# Patient Record
Sex: Male | Born: 1985 | Race: Black or African American | Hispanic: No | Marital: Single | Smoking: Current some day smoker
Health system: Southern US, Community
[De-identification: ages and names within clinical notes are randomized; demographics above are authoritative.]

## PROBLEM LIST (undated history)

## (undated) DIAGNOSIS — F329 Major depressive disorder, single episode, unspecified: Secondary | ICD-10-CM

## (undated) DIAGNOSIS — F319 Bipolar disorder, unspecified: Secondary | ICD-10-CM

## (undated) DIAGNOSIS — F32A Depression, unspecified: Secondary | ICD-10-CM

---
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
## 2004-01-15 ENCOUNTER — Emergency Department (HOSPITAL_COMMUNITY): Admission: EM | Admit: 2004-01-15 | Discharge: 2004-01-15 | Payer: Self-pay | Admitting: Emergency Medicine

## 2006-08-22 ENCOUNTER — Emergency Department (HOSPITAL_COMMUNITY): Admission: EM | Admit: 2006-08-22 | Discharge: 2006-08-22 | Payer: Self-pay | Admitting: Emergency Medicine

## 2011-01-31 ENCOUNTER — Emergency Department (HOSPITAL_COMMUNITY)
Admission: EM | Admit: 2011-01-31 | Discharge: 2011-01-31 | Disposition: A | Discharge status: Home / Self Care | Payer: Self-pay | Attending: Emergency Medicine | Admitting: Emergency Medicine

## 2011-01-31 DIAGNOSIS — R05 Cough: Secondary | ICD-10-CM | POA: Insufficient documentation

## 2011-01-31 DIAGNOSIS — R0602 Shortness of breath: Secondary | ICD-10-CM | POA: Insufficient documentation

## 2011-01-31 DIAGNOSIS — R197 Diarrhea, unspecified: Secondary | ICD-10-CM | POA: Insufficient documentation

## 2011-01-31 DIAGNOSIS — R059 Cough, unspecified: Secondary | ICD-10-CM | POA: Insufficient documentation

## 2011-01-31 DIAGNOSIS — J45901 Unspecified asthma with (acute) exacerbation: Secondary | ICD-10-CM | POA: Insufficient documentation

## 2011-01-31 DIAGNOSIS — R112 Nausea with vomiting, unspecified: Secondary | ICD-10-CM | POA: Insufficient documentation

## 2011-01-31 LAB — BASIC METABOLIC PANEL
BUN: 6 mg/dL (ref 6–23)
CO2: 22 mEq/L (ref 19–32)
Calcium: 9.6 mg/dL (ref 8.4–10.5)
Chloride: 98 mEq/L (ref 96–112)
Creatinine, Ser: 0.84 mg/dL (ref 0.50–1.35)
GFR calc Af Amer: >60 mL/min (ref >60)
GFR calc non Af Amer: >60 mL/min (ref >60)
Glucose, Bld: 85 mg/dL (ref 70–99)
Potassium: 4.8 mEq/L (ref 3.5–5.1)
Sodium: 135 mEq/L (ref 135–145)

## 2016-02-04 ENCOUNTER — Emergency Department (HOSPITAL_COMMUNITY)
Admission: EM | Admit: 2016-02-04 | Discharge: 2016-02-04 | Disposition: A | Discharge status: Other Acute Inpatient Hospital | Payer: Self-pay | Attending: Emergency Medicine | Admitting: Emergency Medicine

## 2016-02-04 ENCOUNTER — Inpatient Hospital Stay (HOSPITAL_COMMUNITY): Payer: Federal, State, Local not specified - Other

## 2016-02-04 ENCOUNTER — Encounter (HOSPITAL_COMMUNITY): Payer: Self-pay

## 2016-02-04 ENCOUNTER — Encounter (HOSPITAL_COMMUNITY): Payer: Self-pay | Admitting: Emergency Medicine

## 2016-02-04 ENCOUNTER — Inpatient Hospital Stay (HOSPITAL_COMMUNITY)
Admission: AD | Admit: 2016-02-04 | Discharge: 2016-02-09 | DRG: 885 | Disposition: A | Discharge status: Home / Self Care | Payer: Federal, State, Local not specified - Other | Source: Intra-hospital | Attending: Emergency Medicine | Admitting: Emergency Medicine

## 2016-02-04 DIAGNOSIS — F17210 Nicotine dependence, cigarettes, uncomplicated: Secondary | ICD-10-CM | POA: Diagnosis present

## 2016-02-04 DIAGNOSIS — G4700 Insomnia, unspecified: Secondary | ICD-10-CM

## 2016-02-04 DIAGNOSIS — F1220 Cannabis dependence, uncomplicated: Secondary | ICD-10-CM | POA: Diagnosis present

## 2016-02-04 DIAGNOSIS — Z818 Family history of other mental and behavioral disorders: Secondary | ICD-10-CM

## 2016-02-04 DIAGNOSIS — Z915 Personal history of self-harm: Secondary | ICD-10-CM | POA: Diagnosis not present

## 2016-02-04 DIAGNOSIS — F250 Schizoaffective disorder, bipolar type: Principal | ICD-10-CM | POA: Diagnosis present

## 2016-02-04 DIAGNOSIS — Z813 Family history of other psychoactive substance abuse and dependence: Secondary | ICD-10-CM

## 2016-02-04 DIAGNOSIS — F1010 Alcohol abuse, uncomplicated: Secondary | ICD-10-CM | POA: Diagnosis not present

## 2016-02-04 DIAGNOSIS — Z5900 Homelessness unspecified: Secondary | ICD-10-CM

## 2016-02-04 DIAGNOSIS — R45851 Suicidal ideations: Secondary | ICD-10-CM

## 2016-02-04 DIAGNOSIS — K219 Gastro-esophageal reflux disease without esophagitis: Secondary | ICD-10-CM | POA: Diagnosis present

## 2016-02-04 DIAGNOSIS — Z833 Family history of diabetes mellitus: Secondary | ICD-10-CM | POA: Diagnosis not present

## 2016-02-04 DIAGNOSIS — F329 Major depressive disorder, single episode, unspecified: Secondary | ICD-10-CM | POA: Diagnosis present

## 2016-02-04 DIAGNOSIS — F419 Anxiety disorder, unspecified: Secondary | ICD-10-CM | POA: Diagnosis present

## 2016-02-04 DIAGNOSIS — R443 Hallucinations, unspecified: Secondary | ICD-10-CM | POA: Insufficient documentation

## 2016-02-04 DIAGNOSIS — R0789 Other chest pain: Secondary | ICD-10-CM | POA: Diagnosis present

## 2016-02-04 DIAGNOSIS — F333 Major depressive disorder, recurrent, severe with psychotic symptoms: Secondary | ICD-10-CM | POA: Diagnosis present

## 2016-02-04 LAB — COMPREHENSIVE METABOLIC PANEL
ALK PHOS: 62 U/L (ref 38–126)
ALT: 14 U/L — ABNORMAL LOW (ref 17–63)
ANION GAP: 8 (ref 5–15)
AST: 19 U/L (ref 15–41)
Albumin: 4.5 g/dL (ref 3.5–5.0)
BUN: 6 mg/dL (ref 6–20)
CALCIUM: 9.2 mg/dL (ref 8.9–10.3)
CO2: 28 mmol/L (ref 22–32)
CREATININE: 0.78 mg/dL (ref 0.61–1.24)
Chloride: 105 mmol/L (ref 101–111)
Glucose, Bld: 92 mg/dL (ref 65–99)
Potassium: 3.1 mmol/L — ABNORMAL LOW (ref 3.5–5.1)
SODIUM: 141 mmol/L (ref 135–145)
Total Bilirubin: 0.6 mg/dL (ref 0.3–1.2)
Total Protein: 7.4 g/dL (ref 6.5–8.1)

## 2016-02-04 LAB — RAPID URINE DRUG SCREEN, HOSP PERFORMED
Amphetamines: NOT DETECTED
Barbiturates: NOT DETECTED
Benzodiazepines: NOT DETECTED
COCAINE: NOT DETECTED
OPIATES: NOT DETECTED
Tetrahydrocannabinol: POSITIVE — AB

## 2016-02-04 LAB — URINALYSIS, ROUTINE W REFLEX MICROSCOPIC
Bilirubin Urine: NEGATIVE
GLUCOSE, UA: NEGATIVE mg/dL
Hgb urine dipstick: NEGATIVE
KETONES UR: NEGATIVE mg/dL
NITRITE: NEGATIVE
PH: 7.5 (ref 5.0–8.0)
Protein, ur: NEGATIVE mg/dL
SPECIFIC GRAVITY, URINE: 1.019 (ref 1.005–1.030)

## 2016-02-04 LAB — URINE MICROSCOPIC-ADD ON: Bacteria, UA: NONE SEEN

## 2016-02-04 LAB — SALICYLATE LEVEL

## 2016-02-04 LAB — CBC
HCT: 40.1 % (ref 39.0–52.0)
HEMOGLOBIN: 14.3 g/dL (ref 13.0–17.0)
MCH: 29.2 pg (ref 26.0–34.0)
MCHC: 35.7 g/dL (ref 30.0–36.0)
MCV: 81.8 fL (ref 78.0–100.0)
PLATELETS: 264 10*3/uL (ref 150–400)
RBC: 4.9 MIL/uL (ref 4.22–5.81)
RDW: 13.2 % (ref 11.5–15.5)
WBC: 12.8 10*3/uL — ABNORMAL HIGH (ref 4.0–10.5)

## 2016-02-04 LAB — ETHANOL: ALCOHOL ETHYL (B): 68 mg/dL — AB (ref <5)

## 2016-02-04 LAB — ACETAMINOPHEN LEVEL

## 2016-02-04 MED ORDER — HALOPERIDOL 0.5 MG PO TABS
0.5000 mg | ORAL_TABLET | Freq: Two times a day (BID) | ORAL | Status: DC
  Administered 2016-02-05 (×2): 0.5 mg via ORAL
  Filled 2016-02-04 (×4): qty 1

## 2016-02-04 MED ORDER — MAGNESIUM HYDROXIDE 400 MG/5ML PO SUSP: 30.0000 mL | Freq: Every day | ORAL | Status: DC | PRN

## 2016-02-04 MED ORDER — TRAZODONE HCL 50 MG PO TABS
50.0000 mg | ORAL_TABLET | Freq: Every day | ORAL | Status: DC
  Administered 2016-02-05: 50 mg via ORAL
  Filled 2016-02-04 (×2): qty 1

## 2016-02-04 MED ORDER — CLONIDINE HCL 0.2 MG PO TABS
0.2000 mg | ORAL_TABLET | Freq: Once | ORAL | Status: AC
  Administered 2016-02-04: 0.2 mg via ORAL
  Filled 2016-02-04: qty 1
  Filled 2016-02-04: qty 2

## 2016-02-04 MED ORDER — FLUOXETINE HCL 20 MG PO CAPS
20.0000 mg | ORAL_CAPSULE | Freq: Every day | ORAL | Status: DC
  Administered 2016-02-05: 20 mg via ORAL
  Filled 2016-02-04 (×2): qty 1

## 2016-02-04 MED ORDER — ENSURE ENLIVE PO LIQD
237.0000 mL | Freq: Two times a day (BID) | ORAL | Status: DC
  Administered 2016-02-05 – 2016-02-09 (×6): 237 mL via ORAL
  Filled 2016-02-04: qty 237

## 2016-02-04 MED ORDER — POTASSIUM CHLORIDE CRYS ER 20 MEQ PO TBCR
40.0000 meq | EXTENDED_RELEASE_TABLET | Freq: Once | ORAL | Status: AC
  Administered 2016-02-04: 40 meq via ORAL
  Filled 2016-02-04: qty 2

## 2016-02-04 MED ORDER — ACETAMINOPHEN 325 MG PO TABS: 650.0000 mg | ORAL_TABLET | Freq: Four times a day (QID) | ORAL | Status: DC | PRN

## 2016-02-04 MED ORDER — BENZTROPINE MESYLATE 0.5 MG PO TABS
0.5000 mg | ORAL_TABLET | Freq: Two times a day (BID) | ORAL | Status: DC
  Administered 2016-02-05 – 2016-02-07 (×6): 0.5 mg via ORAL
  Filled 2016-02-04 (×10): qty 1

## 2016-02-04 MED ORDER — TRAZODONE HCL 50 MG PO TABS: 50.0000 mg | ORAL_TABLET | Freq: Every day | ORAL | Status: DC

## 2016-02-04 MED ORDER — FLUOXETINE HCL 20 MG PO CAPS
20.0000 mg | ORAL_CAPSULE | Freq: Every day | ORAL | Status: DC
  Administered 2016-02-04: 20 mg via ORAL
  Filled 2016-02-04: qty 1

## 2016-02-04 MED ORDER — KETOROLAC TROMETHAMINE 30 MG/ML IJ SOLN
30.0000 mg | Freq: Once | INTRAMUSCULAR | Status: AC
  Administered 2016-02-04: 30 mg via INTRAVENOUS
  Filled 2016-02-04: qty 1

## 2016-02-04 MED ORDER — HALOPERIDOL 1 MG PO TABS
0.5000 mg | ORAL_TABLET | Freq: Two times a day (BID) | ORAL | Status: DC
  Administered 2016-02-04: 0.5 mg via ORAL
  Filled 2016-02-04: qty 1

## 2016-02-04 MED ORDER — IBUPROFEN 400 MG PO TABS: 400.0000 mg | ORAL_TABLET | Freq: Four times a day (QID) | ORAL | Status: DC | PRN

## 2016-02-04 MED ORDER — LORAZEPAM 1 MG PO TABS
1.0000 mg | ORAL_TABLET | Freq: Once | ORAL | Status: AC
  Administered 2016-02-04: 1 mg via ORAL
  Filled 2016-02-04: qty 1

## 2016-02-04 MED ORDER — BENZTROPINE MESYLATE 1 MG PO TABS
0.5000 mg | ORAL_TABLET | Freq: Two times a day (BID) | ORAL | Status: DC
  Administered 2016-02-04: 0.5 mg via ORAL
  Filled 2016-02-04: qty 1

## 2016-02-04 MED ORDER — ALUM & MAG HYDROXIDE-SIMETH 200-200-20 MG/5ML PO SUSP: 30.0000 mL | ORAL | Status: DC | PRN

## 2016-02-04 NOTE — Progress Notes (Signed)
Left message for Pt's emergency contact, Scot Junracey McGee at [phone] to call back so that this staff member can notify that he has been moved to [facility] ED for cardiac workup.

## 2016-02-04 NOTE — ED Notes (Signed)
Patient noted sleeping in room. No complaints, stable, in no acute distress. Q15 minute rounds and monitoring via [employer] to continue.  

## 2016-02-04 NOTE — Discharge Instructions (Signed)

## 2016-02-04 NOTE — Progress Notes (Signed)
T.C. To [facility], to charge nurse Lequita HaltMorgan, informing her of Nehemias being transferred to ED for evaluation of chest pain.

## 2016-02-04 NOTE — Progress Notes (Signed)
Ko starting c/o chest pain at 2000.  BP coming down and he stated that the pain worsens with movement.  Attempted to call Gerrianne Scaleharles Cober NP for orders without success.  Called Dr. Elna BreslowEappen for admission orders and to report chest pain.  She wants him to be evaluated at the ED for the chest pain.

## 2016-02-04 NOTE — Progress Notes (Signed)
BP elevated 186/113 with dinamap, 170/118 manual.  Spoke with Renata Capriceonrad, NP.  Order obtained for clonidine 0.2mg  once and recheck BP in 30 minutes.  If BP is not down we will need to send him back to the ED for evaluation.

## 2016-02-04 NOTE — ED Notes (Signed)
On the phone 

## 2016-02-04 NOTE — ED Provider Notes (Signed)
CSN: [id number]     Arrival date & time 02/04/16  2117 History   First MD Initiated Contact with Patient 02/04/16 2141     Chief Complaint  Patient presents with  . Chest Pain     (Consider location/radiation/quality/duration/timing/severity/associated sxs/prior Treatment) HPI Comments: Patient presents to the emergency department with chief complaint of chest pain. He states pain started about 2 hours ago. It is located in the center of his chest. He states that it is worsened with movement and with chest flexion. He denies any associated shortness of breath. He does report a history of asthma, but denies any wheezing or chest tightness. He denies any change in his symptoms with lying versus sitting. He has not taken anything for his pain. Patient is moderate. Patient is currently at behavioral health for suicidal ideation under voluntary commitment.  The history is provided by the patient. No language interpreter was used.    History reviewed. No pertinent past medical history. History reviewed. No pertinent past surgical history. History reviewed. No pertinent family history. Social History  Substance Use Topics  . Smoking status: Current Some Day Smoker -- 0.25 packs/day    Types: Cigarettes  . Smokeless tobacco: None  . Alcohol Use: No    Review of Systems  Constitutional: Negative for fever and chills.  Respiratory: Negative for shortness of breath.   Cardiovascular: Positive for chest pain.  Gastrointestinal: Negative for nausea, vomiting, diarrhea and constipation.  Genitourinary: Negative for dysuria.  All other systems reviewed and are negative.     Allergies  Review of patient's allergies indicates no known allergies.  Home Medications   Prior to Admission medications   Not on File   BP 150/101 mmHg  Pulse 50  Temp(Src) 98 F (36.7 C) (Oral)  Resp 18  Ht 6\' 1"  (1.854 m)  Wt 63.05 kg  BMI 18.34 kg/m2  SpO2 100% Physical Exam  Constitutional: He is  oriented to person, place, and time. He appears well-developed and well-nourished.  HENT:  Head: Normocephalic and atraumatic.  Eyes: Conjunctivae and EOM are normal. Pupils are equal, round, and reactive to light. Right eye exhibits no discharge. Left eye exhibits no discharge. No scleral icterus.  Neck: Normal range of motion. Neck supple. No JVD present.  Cardiovascular: Normal rate, regular rhythm and normal heart sounds.  Exam reveals no gallop and no friction rub.   No murmur heard. Pulmonary/Chest: Effort normal and breath sounds normal. No respiratory distress. He has no wheezes. He has no rales. He exhibits no tenderness.  Clear to auscultation bilaterally Anterior chest wall mildly tender to palpation, pain is worsened with chest flexion  Abdominal: Soft. He exhibits no distension and no mass. There is no tenderness. There is no rebound and no guarding.  Musculoskeletal: Normal range of motion. He exhibits no edema or tenderness.  Neurological: He is alert and oriented to person, place, and time.  Skin: Skin is warm and dry.  Psychiatric: He has a normal mood and affect. His behavior is normal. Judgment and thought content normal.  Nursing note and vitals reviewed.   ED Course  Procedures (including critical care time)  Imaging Review Dg Chest 2 View  02/04/2016  CLINICAL DATA:  Chest pain. EXAM: CHEST  2 VIEW COMPARISON:  None. FINDINGS: Lungs are hyperexpanded. The lungs are clear wiithout focal pneumonia, edema, pneumothorax or pleural effusion. The cardiopericardial silhouette is within normal limits for size. The visualized bony structures of the thorax are intact. IMPRESSION: No active cardiopulmonary disease.  Electronically Signed   By: Kennith CenterEric  Mansell M.D.   On: 02/04/2016 22:48   I have personally reviewed and evaluated these images and lab results as part of my medical decision-making.   EKG Interpretation   Date/Time:  Saturday February 04 2016 21:43:20 EDT Ventricular  Rate:  55 PR Interval:    QRS Duration: 89 QT Interval:  459 QTC Calculation: 439 R Axis:   82 Text Interpretation:  Sinus rhythm ST elevation suggests acute  pericarditis Confirmed by HAVILAND MD, JULIE ([id number]) on 02/04/2016 9:48:51  PM      MDM   Final diagnoses:  Chest wall pain    Patient with chest pain started about 2 hours ago. It is for producible with chest flexion palpation. Chest x-ray is negative. Low risk for ACS, and PE. Patient feels much better after Toradol. No ischemic EKG changes, but does have some diffuse ST elevation, which could be pericarditis. Will discharge patient on anti-inflammatories.   Roxy Horsemanobert Jabe Jeanbaptiste, PA-C 02/04/16 [id number]  Jacalyn LefevreJulie Haviland, MD 02/05/16 [phone]

## 2016-02-04 NOTE — ED Notes (Signed)
Pt here voluntarily with [employer]. Pt states he is hearing voices telling him to kill himself. Pt denies HI. Pt is exteremly tearful during assessment. Pt states he "just wants the voices out of his head". To every other question pt answered "I don't know". Per [employer], pt's girlfriend called them because pt was threatening to harm himself. Pt has superficial old cuts to his wrists. Pt's girlfriend told police that he is not currently on any behavioral health medications.

## 2016-02-04 NOTE — Progress Notes (Addendum)
EMS called for transport to ED for evaluation of chest pain.

## 2016-02-04 NOTE — ED Notes (Signed)
Pt c/o central CP x2 hours, hurts with movement. Pt in NAD, pain 7/10. Given 1 nitro 324 asa by ems without relief. [facility].

## 2016-02-04 NOTE — ED Notes (Addendum)
[facility] contacted for transport

## 2016-02-04 NOTE — ED Notes (Signed)
Per [facility], pt had clonidine PTA

## 2016-02-04 NOTE — ED Notes (Signed)
Up to the bathroom 

## 2016-02-04 NOTE — ED Provider Notes (Signed)
CSN: [id number]     Arrival date & time 02/04/16  0045 History   First MD Initiated Contact with Patient 02/04/16 0119     Chief Complaint  Patient presents with  . Medical Clearance  . Suicidal  . Hallucinations     (Consider location/radiation/quality/duration/timing/severity/associated sxs/prior Treatment) HPI Comments: 30 y/o male presents to the ED for suicidal ideations. He states that he has been hearing voices for months and trying to self medication with marijuana to make the voices stop. He states that the voices have been telling him to kill himself. He "just wants the voices out of my head" and will occasionally bang his head against the wall to try and make them stop. He reports trying to slit his wrists to kill himself recently. Patient tearful, stating he is scared. He denies a hx of similar symptoms. He does allude to [facility]. Patient voluntary and open to inpatient management.  The history is provided by the patient. No language interpreter was used.    History reviewed. No pertinent past medical history. History reviewed. No pertinent past surgical history. History reviewed. No pertinent family history. Social History  Substance Use Topics  . Smoking status: Unknown If Ever Smoked  . Smokeless tobacco: None  . Alcohol Use: No    Review of Systems  Psychiatric/Behavioral: Positive for suicidal ideas, hallucinations, behavioral problems and agitation.    Allergies  Review of patient's allergies indicates no known allergies.  Home Medications   Prior to Admission medications   Not on File   BP 160/98 mmHg  Pulse 65  Temp(Src) 98.1 F (36.7 C) (Oral)  Resp 12  Ht 6' (1.829 m)  Wt 64.456 kg  BMI 19.27 kg/m2  SpO2 99% Physical Exam  Constitutional: He is oriented to person, place, and time. He appears well-developed and well-nourished. No distress.  HENT:  Head: Normocephalic and atraumatic.  Eyes: Conjunctivae and EOM are normal.  No scleral icterus.  Neck: Normal range of motion.  Cardiovascular: Normal rate, regular rhythm and intact distal pulses.   Pulmonary/Chest: Effort normal. No respiratory distress.  Musculoskeletal: Normal range of motion.  Neurological: He is alert and oriented to person, place, and time. He exhibits normal muscle tone. Coordination normal.  Skin: Skin is warm and dry. No rash noted. He is not diaphoretic. No erythema. No pallor.  Psychiatric: His speech is normal. His mood appears anxious. He is agitated. He exhibits a depressed mood. He expresses suicidal ideation. He expresses no homicidal ideation. He expresses no suicidal plans.  Patient tearful. States he is scared; appears anxious.  Nursing note and vitals reviewed.   ED Course  Procedures (including critical care time) Labs Review Labs Reviewed  COMPREHENSIVE METABOLIC PANEL - Abnormal; Notable for the following:    Potassium 3.1 (*)    ALT 14 (*)    All other components within normal limits  ETHANOL - Abnormal; Notable for the following:    Alcohol, Ethyl (B) 68 (*)    All other components within normal limits  ACETAMINOPHEN LEVEL - Abnormal; Notable for the following:    Acetaminophen (Tylenol), Serum <10 (*)    All other components within normal limits  CBC - Abnormal; Notable for the following:    WBC 12.8 (*)    All other components within normal limits  SALICYLATE LEVEL  URINE RAPID DRUG SCREEN, HOSP PERFORMED    Imaging Review No results found.   I have personally reviewed and evaluated these images and  lab results as part of my medical decision-making.   EKG Interpretation None      MDM   Final diagnoses:  Hallucinations  Homelessness    Patient medically cleared. He will be evaluated by psychiatry in the morning. Disposition to be determined by oncoming ED provider.   Filed Vitals:   02/04/16 0110 02/04/16 0156 02/04/16 0236  BP: 168/109 148/109 160/98  Pulse: 88  65  Temp: 98.4 F (36.9 C)   98.1 F (36.7 C)  TempSrc: Oral  Oral  Resp:   12  Height: 6' (1.829 m)    Weight: 64.456 kg    SpO2: 100%  99%     Antony MaduraKelly Chaundra Abreu, PA-C 02/04/16 [id number]  Rolland PorterMark James, MD 02/17/16 1407

## 2016-02-04 NOTE — BH Assessment (Addendum)
Assessment Note  Charles Lam is an 30 y.o. male presenting to [facility] voluntarily reporting increasing hallucinations over the past two weeks. Patient states that last Friday he "freaked out: stating that he thought someone was "after my family" referring to his girlfriend and her 22thirteen year old child. Patient states that his girlfriend told him he "needed to get help" and he moved in with his sister. Patient states that today he was with his sister and her boyfriend and they were pulled over. He states that his sister's boyfriend asked him to "take the charge" for a gun that was in the vehicle and he states that he refused and his sister told him that he could no longer live with her. Patient states "I didn't have no where to go and I just freaked out and the police came and I just freaked out, I don't know if I was panicking or what." Patient reports "I'm afraid because I haven't been able to control my emotions as of late, everything goes out the door."   Patient states that he attempted suicide prior to arrival by "banging my head on a metal pole." Patient reports that his girlfriend was present and "she stopped me from hitting my head on the pole." Patient reports that he also "tried" to cut his wrist earlier today and he was stopped by his niece. He states that he last attempted suicide on Tuesday by "trying to scrape a knife against my wrist and push it in." Patient states 'I've been trying to kill myself since I was a teenager, I used to drink bleach." Patient reports that he thinks that he has attempted suicide "about 20 times" in his lifetime. Patient reports that he has self-injured by cutting since age 30 and is triggered by "stress." Patient states that he last cut himself on Tuesday with the intent to "draw blood." Patient reports that his previous suicide attempts are triggered by "loss or the threat of loss" reporting that he attempted suicide Tuesday "because I lost my family" referring to  his girlfriend and her child. Patient reports that he has been in a relationship for the past two years stating "it was the best two years of my life." Patient denies HI and history of aggression. Patient denies pending charges, upcoming court dates, and active probation. Patient denies access to weapons/firearms. Patient denies history of trauma/abuse. Patient reports that he uses THC 2 times per week since age 30 and last used "a bong" today. Patient denies use of other drugs and alcohol. Patient UDS not collected and BAL 68 at time of assessment.   Patient is alert and oriented x4. Patient is calm and cooperative during the assessment and does not make eye contact as his eyes are closed. Patient is tearful at times and appears depressed. Patient mood and affect congruent. Patient denies outpatient treatment and reports that he was seen at [facility] "years ago because I was suicidal" but cannot recall [facility]. Patient denies other hospitalizations. Patient denies medication for depression. Patient endorses his most recent triggers as loss of his family and homelessness. Patient endorses symptoms of depression as; insomnia, tearfulness, isolation, fatigue, anhedonia, feelings of hopelessness and worthlessness, irritability and loss of appetite.   Disposition pending psychiatric evaluation.   Diagnosis: Major Depressive Diosrder, Single Episode, Severe  Past Medical History: History reviewed. No pertinent past medical history.  History reviewed. No pertinent past surgical history.  Family History: History reviewed. No pertinent family history.  Social  History:  reports that he does not drink alcohol. His tobacco and drug histories are not on file.  Additional Social History:  Alcohol / Drug Use Pain Medications: See MAR Prescriptions: See MAR Over the Counter: See MAR History of alcohol / drug use?: Yes Longest period of sobriety (when/how long): 9 months Substance #1 Name of  Substance 1: THC 1 - Age of First Use: 26 1 - Amount (size/oz): a bong 1 - Frequency: 2x weekly 1 - Duration: ongoing 1 - Last Use / Amount: today  CIWA: CIWA-Ar BP: 160/98 mmHg Pulse Rate: 65 COWS:    Allergies: No Known Allergies  Home Medications:  (Not in a hospital admission)  OB/GYN Status:  No LMP for male patient.  General Assessment Data Location of Assessment: [facility] TTS Assessment: In system Is this a Tele or Face-to-Face Assessment?: Face-to-Face Is this an Initial Assessment or a Re-assessment for this encounter?: Initial Assessment Marital status: Single Is patient pregnant?: No Pregnancy Status: No Living Arrangements: Spouse/significant other, Children Can pt return to current living arrangement?: Yes Admission Status: Voluntary Is patient capable of signing voluntary admission?: Yes Referral Source: Self/Family/Friend     Crisis Care Plan Living Arrangements: Spouse/significant other, Children Name of Psychiatrist: None Name of Therapist: None  Education Status Is patient currently in school?: No Highest grade of school patient has completed: 12th  Risk to self with the past 6 months Suicidal Ideation: Yes-Currently Present Has patient been a risk to self within the past 6 months prior to admission? : Yes Suicidal Intent: Yes-Currently Present Has patient had any suicidal intent within the past 6 months prior to admission? : Yes Is patient at risk for suicide?: Yes Suicidal Plan?: Yes-Currently Present Has patient had any suicidal plan within the past 6 months prior to admission? : No Specify Current Suicidal Plan: "bang my head against a metal pole or cut myself with a knife" Access to Means: Yes Specify Access to Suicidal Means: knife pole What has been your use of drugs/alcohol within the last 12 months?: THC  Previous Attempts/Gestures: Yes How many times?: 20 Other Self Harm Risks: cutting (since age 30, "whenever I'm stressed"  ) Triggers for Past Attempts: Other (Comment) ("loss my family" ) Intentional Self Injurious Behavior: Cutting (last cut tuesday or wednesday) Comment - Self Injurious Behavior: cutting since 20 "to draw blood"  Family Suicide History: No Recent stressful life event(s): Loss (Comment) (of family) Persecutory voices/beliefs?: No Depression: Yes Depression Symptoms: Insomnia, Tearfulness, Isolating, Fatigue, Loss of interest in usual pleasures, Feeling worthless/self pity, Feeling angry/irritable Substance abuse history and/or treatment for substance abuse?: Yes Suicide prevention information given to non-admitted patients: Not applicable  Risk to Others within the past 6 months Homicidal Ideation: No Does patient have any lifetime risk of violence toward others beyond the six months prior to admission? : No Thoughts of Harm to Others: No Current Homicidal Intent: No Current Homicidal Plan: No Access to Homicidal Means: No Identified Victim: Denies History of harm to others?: No Assessment of Violence: None Noted Violent Behavior Description: Denies Does patient have access to weapons?: No Criminal Charges Pending?: No Does patient have a court date: No Is patient on probation?: No  Psychosis Hallucinations: Auditory (past two years, worse over past week, ) Delusions: Unspecified ("tjought somebody was coming for my family and I freaked out)  Mental Status Report Appearance/Hygiene: Disheveled Eye Contact: Poor Motor Activity: Unremarkable Speech: Logical/coherent Level of Consciousness: Alert Mood: Depressed Affect: Depressed Anxiety Level: None Thought Processes:  Coherent, Relevant Judgement: Partial Orientation: Person, Place, Time, Situation, Appropriate for developmental age Obsessive Compulsive Thoughts/Behaviors: None  Cognitive Functioning Concentration: Poor Memory: Recent Intact, Remote Intact IQ: Average Insight: Fair Impulse Control: Fair Appetite:  Poor Weight Loss: 10 Weight Gain: 0 Sleep: Decreased Total Hours of Sleep:  (2-3) Vegetative Symptoms: Staying in bed, Not bathing  ADLScreening [facility] Assessment Services) Patient's cognitive ability adequate to safely complete daily activities?: Yes Patient able to express need for assistance with ADLs?: Yes Independently performs ADLs?: Yes (appropriate for developmental age)  Prior Inpatient Therapy Prior Inpatient Therapy: Yes Prior Therapy Facilty/Provider(s): [facility] Reason for Treatment: SI  Prior Outpatient Therapy Prior Outpatient Therapy: No Prior Therapy Dates: N/A Prior Therapy Facilty/Provider(s): N/A Reason for Treatment: N/A Does patient have an ACCT team?: No Does patient have Intensive In-House Services?  : No Does patient have [facility] services? : No Does patient have P4CC services?: No  ADL Screening (condition at time of admission) Patient's cognitive ability adequate to safely complete daily activities?: Yes Is the patient deaf or have difficulty hearing?: No Does the patient have difficulty seeing, even when wearing glasses/contacts?: No Does the patient have difficulty concentrating, remembering, or making decisions?: No Patient able to express need for assistance with ADLs?: Yes Does the patient have difficulty dressing or bathing?: No Independently performs ADLs?: Yes (appropriate for developmental age) Does the patient have difficulty walking or climbing stairs?: No Weakness of Legs: None Weakness of Arms/Hands: None  Home Assistive Devices/Equipment Home Assistive Devices/Equipment: None    Abuse/Neglect Assessment (Assessment to be complete while patient is alone) Physical Abuse: Denies Verbal Abuse: Denies Sexual Abuse: Denies Exploitation of patient/patient's resources: Denies Self-Neglect: Denies Values / Beliefs Cultural Requests During Hospitalization: None Spiritual Requests During Hospitalization: None Consults Spiritual Care Consult  Needed: No Social Work Consult Needed: No [employer]chant [employer] officer (For Healthcare) Does patient have an advance directive?: No Would patient like information on creating an advanced directive?: No - patient declined information    Additional Information 1:1 In Past 12 Months?: No CIRT Risk: No Elopement Risk: No Does patient have medical clearance?: No     Disposition:  Disposition Initial Assessment Completed for this Encounter: Yes Disposition of Patient: Other dispositions (pending psychiatric evaluation. ) Other disposition(s): Other (Comment)  On Site Evaluation by:   Reviewed with Physician:    Makani Seckman 02/04/2016 4:38 AM

## 2016-02-04 NOTE — Consult Note (Signed)
[place] [facility] Consult   Reason for Consult:  Suicidal ideation and worsening depression Referring Physician:  EDP Patient Identification: Charles Lam MRN:  [id number] Principal Diagnosis: Severe recurrent major depression with psychotic features [facility]) Diagnosis:   Patient Active Problem List   Diagnosis Date Noted  . Severe recurrent major depression with psychotic features ([place]) [F33.3] 02/04/2016    Total Time spent with patient: 30 minutes  Subjective:   Charles Lam is a 30 y.o. male patient presented to [facility] with complaints of worsening depression and suicidal ideation.  HPI:  Charles Lam is as 30 y.o. patient seen by this provider and chart reviewed 02/04/2016.   On evaluation:  Charles Lam reports that he has been feeling stressed over his girlfriend, family, and his job.  States that he has had worsening depression and suicidal thoughts.  Patient reports that he is also hearing stuff; "At home I hear  voices up stair; but did not elaborate on what the voices would be saying; always states that he has paranoia that people are trying to hurt him.  He denies homicidal ideation.     Past Psychiatric History: Depression and Cannabis abuse  Risk to Self: Suicidal Ideation: Yes-Currently Present Suicidal Intent: Yes-Currently Present Is patient at risk for suicide?: Yes Suicidal Plan?: Yes-Currently Present Specify Current Suicidal Plan: "bang my head against a metal pole or cut myself with a knife" Access to Means: Yes Specify Access to Suicidal Means: knife pole What has been your use of drugs/alcohol within the last 12 months?: THC  How many times?: 20 Other Self Harm Risks: cutting (since age 50, "whenever I'm stressed" ) Triggers for Past Attempts: Other (Comment) ("loss my family" ) Intentional Self Injurious Behavior: Cutting (last cut tuesday or wednesday) Comment - Self Injurious Behavior: cutting since 20 "to draw blood"  Risk to Others:  Homicidal Ideation: No Thoughts of Harm to Others: No Current Homicidal Intent: No Current Homicidal Plan: No Access to Homicidal Means: No Identified Victim: Denies History of harm to others?: No Assessment of Violence: None Noted Violent Behavior Description: Denies Does patient have access to weapons?: No Criminal Charges Pending?: No Does patient have a court date: No Prior Inpatient Therapy: Prior Inpatient Therapy: Yes Prior Therapy Facilty/Provider(s): [facility] Reason for Treatment: SI Prior Outpatient Therapy: Prior Outpatient Therapy: No Prior Therapy Dates: N/A Prior Therapy Facilty/Provider(s): N/A Reason for Treatment: N/A Does patient have an ACCT team?: No Does patient have Intensive In-House Services?  : No Does patient have [facility] services? : No Does patient have P4CC services?: No  Past Medical History: History reviewed. No pertinent past medical history. History reviewed. No pertinent past surgical history. Family History: History reviewed. No pertinent family history. Family Psychiatric  History: Denies family history of mental illness Social History:  History  Alcohol Use No     History  Drug Use Not on file    Social History   Social History  . Marital Status: Single    Spouse Name: N/A  . Number of Children: N/A  . Years of Education: N/A   Social History Main Topics  . Smoking status: Unknown If Ever Smoked  . Smokeless tobacco: None  . Alcohol Use: No  . Drug Use: None  . Sexual Activity: Not Asked   Other Topics Concern  . None   Social History Narrative  . None   Additional Social History:    Allergies:  No Known Allergies  Labs:  Results for orders placed  or performed during the hospital encounter of 02/04/16 (from the past 48 hour(s))  Comprehensive metabolic panel     Status: Abnormal   Collection Time: 02/04/16  1:20 AM  Result Value Ref Range   Sodium 141 135 - 145 mmol/L   Potassium 3.1 (L) 3.5 - 5.1 mmol/L   Chloride 105  101 - 111 mmol/L   CO2 28 22 - 32 mmol/L   Glucose, Bld 92 65 - 99 mg/dL   BUN 6 6 - 20 mg/dL   Creatinine, Ser 0.78 0.61 - 1.24 mg/dL   Calcium 9.2 8.9 - 10.3 mg/dL   Total Protein 7.4 6.5 - 8.1 g/dL   Albumin 4.5 3.5 - 5.0 g/dL   AST 19 15 - 41 U/L   ALT 14 (L) 17 - 63 U/L   Alkaline Phosphatase 62 38 - 126 U/L   Total Bilirubin 0.6 0.3 - 1.2 mg/dL   GFR calc non Af Amer >60 >60 mL/min   GFR calc Af Amer >60 >60 mL/min    Comment: (NOTE) The eGFR has been calculated using the CKD EPI equation. This calculation has not been validated in all clinical situations. eGFR's persistently <60 mL/min signify possible Chronic Kidney Disease.    Anion gap 8 5 - 15  Ethanol     Status: Abnormal   Collection Time: 02/04/16  1:20 AM  Result Value Ref Range   Alcohol, Ethyl (B) 68 (H) <5 mg/dL    Comment:        LOWEST DETECTABLE LIMIT FOR SERUM ALCOHOL IS 5 mg/dL FOR MEDICAL PURPOSES ONLY   Salicylate level     Status: None   Collection Time: 02/04/16  1:20 AM  Result Value Ref Range   Salicylate Lvl <9.6 2.8 - 30.0 mg/dL  Acetaminophen level     Status: Abnormal   Collection Time: 02/04/16  1:20 AM  Result Value Ref Range   Acetaminophen (Tylenol), Serum <10 (L) 10 - 30 ug/mL    Comment:        THERAPEUTIC CONCENTRATIONS VARY SIGNIFICANTLY. A RANGE OF 10-30 ug/mL MAY BE AN EFFECTIVE CONCENTRATION FOR MANY PATIENTS. HOWEVER, SOME ARE BEST TREATED AT CONCENTRATIONS OUTSIDE THIS RANGE. ACETAMINOPHEN CONCENTRATIONS >150 ug/mL AT 4 HOURS AFTER INGESTION AND >50 ug/mL AT 12 HOURS AFTER INGESTION ARE OFTEN ASSOCIATED WITH TOXIC REACTIONS.   cbc     Status: Abnormal   Collection Time: 02/04/16  1:20 AM  Result Value Ref Range   WBC 12.8 (H) 4.0 - 10.5 K/uL   RBC 4.90 4.22 - 5.81 MIL/uL   Hemoglobin 14.3 13.0 - 17.0 g/dL   HCT 40.1 39.0 - 52.0 %   MCV 81.8 78.0 - 100.0 fL   MCH 29.2 26.0 - 34.0 pg   MCHC 35.7 30.0 - 36.0 g/dL   RDW 13.2 11.5 - 15.5 %   Platelets 264 150 -  400 K/uL  Rapid urine drug screen (hospital performed)     Status: Abnormal   Collection Time: 02/04/16  7:48 AM  Result Value Ref Range   Opiates NONE DETECTED NONE DETECTED   Cocaine NONE DETECTED NONE DETECTED   Benzodiazepines NONE DETECTED NONE DETECTED   Amphetamines NONE DETECTED NONE DETECTED   Tetrahydrocannabinol POSITIVE (A) NONE DETECTED   Barbiturates NONE DETECTED NONE DETECTED    Comment:        DRUG SCREEN FOR MEDICAL PURPOSES ONLY.  IF CONFIRMATION IS NEEDED FOR ANY PURPOSE, NOTIFY LAB WITHIN 5 DAYS.        LOWEST  DETECTABLE LIMITS FOR URINE DRUG SCREEN Drug Class       Cutoff (ng/mL) Amphetamine      1000 Barbiturate      200 Benzodiazepine   809 Tricyclics       983 Opiates          300 Cocaine          300 THC              50   Urinalysis, Routine w reflex microscopic (not at [facility])     Status: Abnormal   Collection Time: 02/04/16  8:15 AM  Result Value Ref Range   Color, Urine YELLOW YELLOW   APPearance CLEAR CLEAR   Specific Gravity, Urine 1.019 1.005 - 1.030   pH 7.5 5.0 - 8.0   Glucose, UA NEGATIVE NEGATIVE mg/dL   Hgb urine dipstick NEGATIVE NEGATIVE   Bilirubin Urine NEGATIVE NEGATIVE   Ketones, ur NEGATIVE NEGATIVE mg/dL   Protein, ur NEGATIVE NEGATIVE mg/dL   Nitrite NEGATIVE NEGATIVE   Leukocytes, UA SMALL (A) NEGATIVE  Urine microscopic-add on     Status: Abnormal   Collection Time: 02/04/16  8:15 AM  Result Value Ref Range   Squamous Epithelial / LPF 0-5 (A) NONE SEEN   WBC, UA 6-30 0 - 5 WBC/hpf   RBC / HPF 0-5 0 - 5 RBC/hpf   Bacteria, UA NONE SEEN NONE SEEN    Current Facility-Administered Medications  Medication Dose Route Frequency Provider Last Rate Last Dose  . benztropine (COGENTIN) tablet 0.5 mg  0.5 mg Oral BID Corena Pilgrim, MD   0.5 mg at 02/04/16 1304  . FLUoxetine (PROZAC) capsule 20 mg  20 mg Oral Daily Lizanne Erker, MD   20 mg at 02/04/16 1304  . haloperidol (HALDOL) tablet 0.5 mg  0.5 mg Oral BID Corena Pilgrim, MD   0.5 mg at 02/04/16 1304  . traZODone (DESYREL) tablet 50 mg  50 mg Oral QHS Corena Pilgrim, MD       No current outpatient prescriptions on file.    Musculoskeletal: Strength & Muscle Tone: within normal limits Gait & Station: normal Patient leans: N/A  Psychiatric Specialty Exam: Physical Exam  Constitutional: He is oriented to person, place, and time.  Neck: Normal range of motion.  Respiratory: Effort normal.  Musculoskeletal: Normal range of motion.  Neurological: He is alert and oriented to person, place, and time.  Skin: Skin is warm and dry.    Review of Systems  Psychiatric/Behavioral: Positive for depression, suicidal ideas, hallucinations and substance abuse. Negative for memory loss. The patient is nervous/anxious.     Blood pressure 150/102, pulse 70, temperature 98 F (36.7 C), temperature source Oral, resp. rate 12, height 6' (1.829 m), weight 64.456 kg (142 lb 1.6 oz), SpO2 100 %.Body mass index is 19.27 kg/(m^2).  General Appearance: Disheveled  Eye Contact:  Minimal  Speech:  Normal Rate  Volume:  Decreased  Mood:  Depressed  Affect:  Depressed and Flat  Thought Process:  Linear  Orientation:  Full (Time, Place, and Person)  Thought Content:  Hallucinations: Auditory  Suicidal Thoughts:  Yes.  without intent/plan  Homicidal Thoughts:  No  Memory:  Immediate;   Fair Recent;   Fair Remote;   Fair  Judgement:  Poor  Insight:  Lacking  Psychomotor Activity:  Normal  Concentration:  Concentration: Fair and Attention Span: Fair  Recall:  Good  Fund of Knowledge:  Fair  Language:  Good  Akathisia:  No  Handed:  Right  AIMS (if indicated):     Assets:  Desire for Improvement Housing Social Support  ADL's:  Intact  Cognition:  WNL  Sleep:        Treatment Plan Summary: Daily contact with patient to assess and evaluate symptoms and progress in treatment and Medication management Medication Started: Major Depression:  Prozac 20 mg  daily Psychosis: Haldol 0.5 mg Bid Extrapyramidal symptoms:  Cogentin 0.5 mg Bid Insomnia:  Trazodone 50 mg Q hs  Disposition: Recommend psychiatric Inpatient admission when medically cleared.    Rankin, Shuvon, NP 02/04/2016 2:19 PM Patient seen face-to-face for psychiatric evaluation, chart reviewed and case discussed with the physician extender and developed treatment plan. Reviewed the information documented and agree with the treatment plan. Corena Pilgrim, MD

## 2016-02-04 NOTE — Tx Team (Signed)
Initial Interdisciplinary Treatment Plan   PATIENT STRESSORS: Marital or family conflict Occupational concerns Substance abuse   PATIENT STRENGTHS: Wellsite geologistCommunication skills General fund of knowledge   PROBLEM LIST: Problem List/Patient Goals Date to be addressed Date deferred Reason deferred Estimated date of resolution  Depression 02/04/16     Suicidal ideation 02/04/16     Psychosis 02/04/16     "I just want to get better" 02/04/16     "I want to do this for my family" 02/04/16                              DISCHARGE CRITERIA:  Improved stabilization in mood, thinking, and/or behavior Verbal commitment to aftercare and medication compliance  PRELIMINARY DISCHARGE PLAN: Outpatient therapy Medication management  PATIENT/FAMIILY INVOLVEMENT: This treatment plan has been presented to and reviewed with the patient, Charles Lam.  The patient and family have been given the opportunity to ask questions and make suggestions.  Norm ParcelHeather V Zaiah Eckerson 02/04/2016, 7:14 PM

## 2016-02-04 NOTE — ED Notes (Signed)
Pt ambulatory w/o difficulty to [facility] w/ pehlam, belongings given to driver. 

## 2016-02-04 NOTE — ED Notes (Signed)
Pt. Transferred to [facility] from ED to room 37. Report from Kari RN. Pt. Oriented to unit including Q15 minute rounds as well as the security cameras for their protection. Patient is alert and oriented, warm and dry in no acute distress. Patient denies SI, HI, and AVH. Pt. Encouraged to let me know if needs arise. 

## 2016-02-04 NOTE — Progress Notes (Signed)
Charles Lam is a 30 y.o. male being admitted voluntarily to [facility] from [facility].  He came to the ED increasing hallucinations and paranoia over the past two weeks.  He moved out of GF house and in with his sister.  Prior to coming to ED, he was with his sister and her boyfriend and they were pulled over by the police. He states that his sister's boyfriend asked him to "take the charge" for a gun that was in the vehicle and he states that he refused.  His sister then told him that he had to move out.  He is currently homeless and "freaked out" because the police came.  He reports multiple suicide attempts today and many since he was a teenager.  He has a history of cutting to "draw blood."  He admits to using pot at least 2-3 times per week.  No other drug use reported.  He denies history of outpatient treatment.  He states that his recent stressors are loss of family and being homeless.  Patient endorses symptoms of depression as; insomnia, tearfulness, isolation, fatigue, anhedonia, feelings of hopelessness and worthlessness, irritability and loss of appetite.  He was very tearful during the assessment.  He just stated "I'm just getting tired."  He was very withdrawn and admitted to hearing voices telling him to harm himself.  He also reports seeing shadows.  BP evaluated and was reassessed after taking clonidine 0.2mg -150/106.  We will continue to monitor his blood pressure.  He is diagnosed with Major Depressive Disorder, Single Episode, Severe.  Admission paperwork completed and signed.  Belongings searched and secured in locker # 53.  Skin assessment completed and no skin issues noted.  Q 15 minute checks initiated for safety.  We will monitor the progress towards his goals.

## 2016-02-05 ENCOUNTER — Encounter (HOSPITAL_COMMUNITY): Payer: Self-pay | Admitting: Psychiatry

## 2016-02-05 DIAGNOSIS — F1010 Alcohol abuse, uncomplicated: Secondary | ICD-10-CM | POA: Clinically undetermined

## 2016-02-05 DIAGNOSIS — F250 Schizoaffective disorder, bipolar type: Secondary | ICD-10-CM | POA: Clinically undetermined

## 2016-02-05 DIAGNOSIS — F1220 Cannabis dependence, uncomplicated: Secondary | ICD-10-CM | POA: Clinically undetermined

## 2016-02-05 DIAGNOSIS — R45851 Suicidal ideations: Secondary | ICD-10-CM

## 2016-02-05 MED ORDER — OLANZAPINE 5 MG PO TBDP: 5.0000 mg | ORAL_TABLET | Freq: Three times a day (TID) | ORAL | Status: DC | PRN

## 2016-02-05 MED ORDER — DIVALPROEX SODIUM ER 500 MG PO TB24
750.0000 mg | ORAL_TABLET | Freq: Every day | ORAL | Status: DC
  Administered 2016-02-05 – 2016-02-08 (×4): 750 mg via ORAL
  Filled 2016-02-05 (×6): qty 1

## 2016-02-05 MED ORDER — IBUPROFEN 600 MG PO TABS
600.0000 mg | ORAL_TABLET | Freq: Four times a day (QID) | ORAL | Status: DC | PRN
  Administered 2016-02-06: 600 mg via ORAL
  Filled 2016-02-05: qty 1
  Filled 2016-02-05: qty 6

## 2016-02-05 MED ORDER — LORAZEPAM 1 MG PO TABS: 1.0000 mg | ORAL_TABLET | Freq: Four times a day (QID) | ORAL | Status: DC | PRN

## 2016-02-05 MED ORDER — LORAZEPAM 2 MG/ML IJ SOLN: 1.0000 mg | Freq: Four times a day (QID) | INTRAMUSCULAR | Status: DC | PRN

## 2016-02-05 MED ORDER — PANTOPRAZOLE SODIUM 20 MG PO TBEC
20.0000 mg | DELAYED_RELEASE_TABLET | Freq: Two times a day (BID) | ORAL | Status: DC
  Administered 2016-02-06 – 2016-02-09 (×7): 20 mg via ORAL
  Filled 2016-02-05: qty 14
  Filled 2016-02-05 (×8): qty 1
  Filled 2016-02-05: qty 14
  Filled 2016-02-05 (×2): qty 1

## 2016-02-05 MED ORDER — HALOPERIDOL 5 MG PO TABS
5.0000 mg | ORAL_TABLET | Freq: Two times a day (BID) | ORAL | Status: DC
  Administered 2016-02-05 – 2016-02-07 (×4): 5 mg via ORAL
  Filled 2016-02-05 (×8): qty 1

## 2016-02-05 MED ORDER — CLONIDINE HCL 0.1 MG PO TABS: 0.1000 mg | ORAL_TABLET | Freq: Two times a day (BID) | ORAL | Status: DC | PRN

## 2016-02-05 MED ORDER — MIRTAZAPINE 7.5 MG PO TABS
7.5000 mg | ORAL_TABLET | Freq: Every day | ORAL | Status: DC
  Administered 2016-02-05 – 2016-02-06 (×2): 7.5 mg via ORAL
  Filled 2016-02-05 (×4): qty 1

## 2016-02-05 MED ORDER — OLANZAPINE 10 MG IM SOLR: 5.0000 mg | Freq: Three times a day (TID) | INTRAMUSCULAR | Status: DC | PRN

## 2016-02-05 NOTE — [facility] Group Notes (Signed)
[facility] LCSW Group Therapy  02/05/2016 11:15 AM  Type of Therapy:  Group Therapy  Participation Level:  Active  Participation Quality:  Appropriate  Affect:  Depressed and Sad  Cognitive:  Alert and Oriented  Insight:  Developing  Engagement in Therapy:  Developing  Modes of Intervention:  Clarification, Exploration, Rapport Building, Socialization and Support  Summary of Progress/Problems: The main focus of today's process group was to identify the patient's current support system and decide on other supports that can be put in place. An emphasis was placed on using counselor, doctor, therapy groups, 12-step groups, and problem-specific support groups to expand supports. Patient processed some feelings of grief as he believes he has lost his son and mother of child due to his mental health issues. Patient feels he has no supports at this time yet [facility]  After group CS[facility] and also placed a copy in his chart for him to have at [facility]. Patient was in bed and seemed appreciative of info. Encouraged patient to avoid isolating in room.   Carney Bernatherine C Bindi Klomp, LCSW

## 2016-02-05 NOTE — [facility] Suicide Risk Assessment (Signed)
[facility]   Nursing information obtained from:  Patient Demographic factors:  Male, Unemployed Current Mental Status:  Suicidal ideation indicated by patient Loss Factors:  Loss of significant relationship, Financial problems / change in socioeconomic status Historical Factors:  Prior suicide attempts, Family history of mental illness or substance abuse, Impulsivity Risk Reduction Factors:  NA  Total Time spent with patient: 30 minutes Principal Problem: Schizoaffective disorder, bipolar type (HCC) Diagnosis:   Patient Active Problem List   Diagnosis Date Noted  . Schizoaffective disorder, bipolar type (HCC) [F25.0] 02/05/2016  . Alcohol use disorder, mild, abuse [F10.10] 02/05/2016  . Cannabis use disorder, severe, dependence (HCC) [F12.20] 02/05/2016  . Insomnia [G47.00]   . Hallucinations [R44.3]    Subjective Data: Patient states " I am always paranoid and I hear voices asking me to kill myself. Pt reports hx of worsening depression as well as suicidal thoughts with recent attempt.'   Continued Clinical Symptoms:  Alcohol Use Disorder Identification Test Final Score (AUDIT): 2 The "Alcohol Use Disorders Identification Test", Guidelines for Use in Primary Care, Second Edition.  World Science writerHealth Organization [facility](WHO). Score between 0-7:  no or low risk or alcohol related problems. Score between 8-15:  moderate risk of alcohol related problems. Score between 16-19:  high risk of alcohol related problems. Score 20 or above:  warrants further diagnostic evaluation for alcohol dependence and treatment.   CLINICAL FACTORS:   Alcohol/Substance Abuse/Dependencies   Musculoskeletal: Psychiatric Specialty Exam: Physical Exam  Nursing note and vitals reviewed.   Review of Systems  Psychiatric/Behavioral: Positive for depression, suicidal ideas, hallucinations and substance abuse. The patient is nervous/anxious and has insomnia.   All other systems reviewed and  are negative.   Blood pressure 150/95, pulse 65, temperature 97.7 F (36.5 C), temperature source Oral, resp. rate 16, height 6\' 1"  (1.854 m), weight 63.05 kg (139 lb), SpO2 100 %.Body mass index is 18.34 kg/(m^2).                        Please see H&P.                                   COGNITIVE FEATURES THAT CONTRIBUTE TO RISK:  Closed-mindedness, Polarized thinking and Thought constriction (tunnel vision)    SUICIDE RISK:   Severe:  Frequent, intense, and enduring suicidal ideation, specific plan, no subjective intent, but some objective markers of intent (i.e., choice of lethal method), the method is accessible, some limited preparatory behavior, evidence of impaired self-control, severe dysphoria/symptomatology, multiple risk factors present, and few if any protective factors, particularly a lack of social support.  PLAN OF CARE: Please see H&P.   I certify that inpatient services furnished can reasonably be expected to improve the patient's condition.   Jalea Bronaugh, MD 02/05/2016, 8:46 AM

## 2016-02-05 NOTE — ED Notes (Signed)
[facility]

## 2016-02-05 NOTE — Progress Notes (Signed)
D.  [facility], chest wall pain diagnosed and given Toradol in ED.  Pt ambulatory and received snack once back on unit.  Night medications given as ordered.  Pt denies pain at this time.  A.  Support and encouragement offered, medication given as ordered  R.  Pt remains safe on unit, presently resting in bed awake in no acute distress.  Will continue to monitor.

## 2016-02-05 NOTE — H&P (Addendum)
Psychiatric Admission Assessment Adult  Patient Identification: Charles Lam MRN:  [id number] Date of Evaluation:  02/05/2016 Chief Complaint: Patient states " I am depressed, I have mood swings and I was trying to kill myself."   Principal Diagnosis: Schizoaffective disorder, bipolar type [facility]) Diagnosis:   Patient Active Problem List   Diagnosis Date Noted  . Schizoaffective disorder, bipolar type ([place]) [F25.0] 02/05/2016  . Alcohol use disorder, mild, abuse [F10.10] 02/05/2016  . Cannabis use disorder, severe, dependence ([place]) [F12.20] 02/05/2016  . Insomnia [G47.00]   . Hallucinations [R44.3]    History of Present Illness: Charles Lam is a 30 y.o.AA male who is single , unemployed , currently homeless , has a hx of depression as well as mood swings and cannabis abuse , presented to [facility] voluntarily reporting increasing hallucinations over the past two weeks.    Per initial notes in EHR :" Patient states that last Friday he "freaked out: stating that he thought someone was "after my family" referring to his girlfriend and her 31 year old child. Patient states that his girlfriend told him he "needed to get help" and he moved in with his sister. Patient states that today he was with his sister and her boyfriend and they were pulled over. He states that his sister's boyfriend asked him to "take the charge" for a gun that was in the vehicle and he states that he refused and his sister told him that he could no longer live with her. Patient states "I didn't have no where to go and I just freaked out and the police came and I just freaked out, I don't know if I was panicking or what." Patient reports "I'm afraid because I haven't been able to control my emotions as of late, everything goes out the door." Patient states that he attempted suicide prior to arrival by "banging my head on a metal pole." Patient reports that his girlfriend was present and "she stopped me from hitting my  head on the pole." Patient reports that he also "tried" to cut his wrist earlier today and he was stopped by his niece. He states that he last attempted suicide on Tuesday by "trying to scrape a knife against my wrist and push it in." Patient states 'I've been trying to kill myself since I was a teenager, I used to drink bleach." Patient reports that he thinks that he has attempted suicide "about 20 times" in his lifetime. Patient reports that he has self-injured by cutting since age 86 and is triggered by "stress." Patient states that he last cut himself on Tuesday with the intent to "draw blood." Patient reports that his previous suicide attempts are triggered by "loss or the threat of loss" reporting that he attempted suicide Tuesday "because I lost my family" referring to his girlfriend and her child. Patient reports that he has been in a relationship for the past two years stating "it was the best two years of my life." Patient denies HI and history of aggression. Patient denies pending charges, upcoming court dates, and active probation. Patient denies access to weapons/firearms. Patient denies history of trauma/abuse. Patient reports that he uses THC 2 times per week since age 69 and last used "a bong" today. Patient denies use of other drugs and alcohol. Patient UDS not collected and BAL 68 at time of assessment.  Patient endorses his most recent triggers as loss of his family and homelessness. Patient endorses symptoms of depression as; insomnia, tearfulness, isolation, fatigue, anhedonia, feelings  of hopelessness and worthlessness, irritability and loss of appetite. "    Patient seen and chart reviewed TODAY.Discussed patient with treatment team. Pt today seen as withdrawn, isolative , sad, hopeless, with SI , several plans as well as sleep issues. Pt also reports paranoia that some one is going to hurt him and his family . Pt endorses AH telling him " just do it," " kill ur self ". Pt reports severe  mood swings when he is aggressive , labile , destroy property and hurt others. Pt reports this has been going on for a long time. Pt reports he started abusing cannabis since it calms him down. Pt reports regular use of cannabis since he were 30 yrs old. Pt reports using alcohol occasionally , but denies getting drunk or having withdrawal sx. Pt as described above has attempted suicide several times and most recently by cutting self and hitting his head against a metal pole. Pt reports he never got psychiatric help with his sx. Pt reports his girlfriend broke up with him and is moving to [place] and he lost his job 2 weeks ago.  Associated Signs/Symptoms: Depression Symptoms:  depressed mood, insomnia, psychomotor agitation, psychomotor retardation, feelings of worthlessness/guilt, difficulty concentrating, hopelessness, recurrent thoughts of death, suicidal thoughts with specific plan, suicidal attempt, weight loss, (Hypo) Manic Symptoms:  Delusions, Distractibility, Hallucinations, Impulsivity, Irritable Mood, Labiality of Mood, Anxiety Symptoms:  anxiety unspecified Psychotic Symptoms:  Delusions, Hallucinations: Auditory Command:  kill self Paranoia, PTSD Symptoms: NA Total Time spent with patient: 45 minutes  Past Psychiatric History: Pt reports a hx of depression, never diagnosed formally. Pt reports hx of several suicide attempts.  Is the patient at risk to self? Yes.    Has the patient been a risk to self in the past 6 months? Yes.    Has the patient been a risk to self within the distant past? Yes.    Is the patient a risk to others? Yes.    Has the patient been a risk to others in the past 6 months? Yes.    Has the patient been a risk to others within the distant past? No.   Prior Inpatient Therapy:  see above Prior Outpatient Therapy:  none  Alcohol Screening: 1. How often do you have a drink containing alcohol?: 2 to 4 times a month 2. How many drinks containing  alcohol do you have on a typical day when you are drinking?: 1 or 2 3. How often do you have six or more drinks on one occasion?: Never Preliminary Score: 0 9. Have you or someone else been injured as a result of your drinking?: No 10. Has a relative or friend or a doctor or another health worker been concerned about your drinking or suggested you cut down?: No Alcohol Use Disorder Identification Test Final Score (AUDIT): 2 Brief Intervention: AUDIT score less than 7 or less-screening does not suggest unhealthy drinking-brief intervention not indicated Substance Abuse History in the last 12 months:  Yes.  cannabis daily, alcohol occasionally Consequences of Substance Abuse: Medical Consequences:  recent admission Family Consequences:  relational issues Previous Psychotropic Medications: No  Psychological Evaluations: No  Past Medical History: Pt reports maternal grandmother had Diabetes. Family History:  Family History  Problem Relation Age of Onset  . Bipolar disorder Mother   . Drug abuse Mother   . Bipolar disorder Sister    Family Psychiatric  History: Pt reports a hx of Bipolar do in mother and sister. Drug abuse in  mother. Tobacco Screening:<0.5 PPD - offered nicotine patch. Social History: Pt is currently homeless, broke up with girl friend , lost job 2 weeks ago. History  Alcohol Use No     History  Drug Use Not on file    Additional Social History:      Pain Medications: See MAR Prescriptions: See MAR Over the Counter: See MAR History of alcohol / drug use?: Yes Longest period of sobriety (when/how long): 9 months Withdrawal Symptoms: Other (Comment) (No history) Name of Substance 1: THC 1 - Age of First Use: 26 1 - Amount (size/oz): a bong 1 - Frequency: 2x weekly 1 - Last Use / Amount: today                  Allergies:  No Known Allergies Lab Results:  Results for orders placed or performed during the hospital encounter of 02/04/16 (from the past 48  hour(s))  Comprehensive metabolic panel     Status: Abnormal   Collection Time: 02/04/16  1:20 AM  Result Value Ref Range   Sodium 141 135 - 145 mmol/L   Potassium 3.1 (L) 3.5 - 5.1 mmol/L   Chloride 105 101 - 111 mmol/L   CO2 28 22 - 32 mmol/L   Glucose, Bld 92 65 - 99 mg/dL   BUN 6 6 - 20 mg/dL   Creatinine, Ser 0.78 0.61 - 1.24 mg/dL   Calcium 9.2 8.9 - 10.3 mg/dL   Total Protein 7.4 6.5 - 8.1 g/dL   Albumin 4.5 3.5 - 5.0 g/dL   AST 19 15 - 41 U/L   ALT 14 (L) 17 - 63 U/L   Alkaline Phosphatase 62 38 - 126 U/L   Total Bilirubin 0.6 0.3 - 1.2 mg/dL   GFR calc non Af Amer >60 >60 mL/min   GFR calc Af Amer >60 >60 mL/min    Comment: (NOTE) The eGFR has been calculated using the CKD EPI equation. This calculation has not been validated in all clinical situations. eGFR's persistently <60 mL/min signify possible Chronic Kidney Disease.    Anion gap 8 5 - 15  Ethanol     Status: Abnormal   Collection Time: 02/04/16  1:20 AM  Result Value Ref Range   Alcohol, Ethyl (B) 68 (H) <5 mg/dL    Comment:        LOWEST DETECTABLE LIMIT FOR SERUM ALCOHOL IS 5 mg/dL FOR MEDICAL PURPOSES ONLY   Salicylate level     Status: None   Collection Time: 02/04/16  1:20 AM  Result Value Ref Range   Salicylate Lvl <6.7 2.8 - 30.0 mg/dL  Acetaminophen level     Status: Abnormal   Collection Time: 02/04/16  1:20 AM  Result Value Ref Range   Acetaminophen (Tylenol), Serum <10 (L) 10 - 30 ug/mL    Comment:        THERAPEUTIC CONCENTRATIONS VARY SIGNIFICANTLY. A RANGE OF 10-30 ug/mL MAY BE AN EFFECTIVE CONCENTRATION FOR MANY PATIENTS. HOWEVER, SOME ARE BEST TREATED AT CONCENTRATIONS OUTSIDE THIS RANGE. ACETAMINOPHEN CONCENTRATIONS >150 ug/mL AT 4 HOURS AFTER INGESTION AND >50 ug/mL AT 12 HOURS AFTER INGESTION ARE OFTEN ASSOCIATED WITH TOXIC REACTIONS.   cbc     Status: Abnormal   Collection Time: 02/04/16  1:20 AM  Result Value Ref Range   WBC 12.8 (H) 4.0 - 10.5 K/uL   RBC 4.90 4.22 -  5.81 MIL/uL   Hemoglobin 14.3 13.0 - 17.0 g/dL   HCT 40.1 39.0 - 52.0 %  MCV 81.8 78.0 - 100.0 fL   MCH 29.2 26.0 - 34.0 pg   MCHC 35.7 30.0 - 36.0 g/dL   RDW 13.2 11.5 - 15.5 %   Platelets 264 150 - 400 K/uL  Rapid urine drug screen (hospital performed)     Status: Abnormal   Collection Time: 02/04/16  7:48 AM  Result Value Ref Range   Opiates NONE DETECTED NONE DETECTED   Cocaine NONE DETECTED NONE DETECTED   Benzodiazepines NONE DETECTED NONE DETECTED   Amphetamines NONE DETECTED NONE DETECTED   Tetrahydrocannabinol POSITIVE (A) NONE DETECTED   Barbiturates NONE DETECTED NONE DETECTED    Comment:        DRUG SCREEN FOR MEDICAL PURPOSES ONLY.  IF CONFIRMATION IS NEEDED FOR ANY PURPOSE, NOTIFY LAB WITHIN 5 DAYS.        LOWEST DETECTABLE LIMITS FOR URINE DRUG SCREEN Drug Class       Cutoff (ng/mL) Amphetamine      1000 Barbiturate      200 Benzodiazepine   824 Tricyclics       235 Opiates          300 Cocaine          300 THC              50   Urinalysis, Routine w reflex microscopic (not at [facility])     Status: Abnormal   Collection Time: 02/04/16  8:15 AM  Result Value Ref Range   Color, Urine YELLOW YELLOW   APPearance CLEAR CLEAR   Specific Gravity, Urine 1.019 1.005 - 1.030   pH 7.5 5.0 - 8.0   Glucose, UA NEGATIVE NEGATIVE mg/dL   Hgb urine dipstick NEGATIVE NEGATIVE   Bilirubin Urine NEGATIVE NEGATIVE   Ketones, ur NEGATIVE NEGATIVE mg/dL   Protein, ur NEGATIVE NEGATIVE mg/dL   Nitrite NEGATIVE NEGATIVE   Leukocytes, UA SMALL (A) NEGATIVE  Urine microscopic-add on     Status: Abnormal   Collection Time: 02/04/16  8:15 AM  Result Value Ref Range   Squamous Epithelial / LPF 0-5 (A) NONE SEEN   WBC, UA 6-30 0 - 5 WBC/hpf   RBC / HPF 0-5 0 - 5 RBC/hpf   Bacteria, UA NONE SEEN NONE SEEN    Blood Alcohol level:  Lab Results  Component Value Date   ETH 68* 36/14/4315    Metabolic Disorder Labs:  No results found for: HGBA1C, MPG No results found for:  PROLACTIN No results found for: CHOL, TRIG, HDL, CHOLHDL, VLDL, LDLCALC  Current Medications: Current Facility-Administered Medications  Medication Dose Route Frequency Provider Last Rate Last Dose  . acetaminophen (TYLENOL) tablet 650 mg  650 mg Oral Q6H PRN Ursula Alert, MD      . alum & mag hydroxide-simeth (MAALOX/MYLANTA) 200-200-20 MG/5ML suspension 30 mL  30 mL Oral Q4H PRN Shauntell Iglesia, MD      . benztropine (COGENTIN) tablet 0.5 mg  0.5 mg Oral BID Ursula Alert, MD   0.5 mg at 02/05/16 0807  . divalproex (DEPAKOTE ER) 24 hr tablet 750 mg  750 mg Oral QHS Kailon Treese, MD      . feeding supplement (ENSURE ENLIVE) (ENSURE ENLIVE) liquid 237 mL  237 mL Oral BID BM Fernando A Cobos, MD      . haloperidol (HALDOL) tablet 5 mg  5 mg Oral BID Rael Yo, MD      . ibuprofen (ADVIL,MOTRIN) tablet 400 mg  400 mg Oral Q6H PRN Montine Circle, PA-C      .  LORazepam (ATIVAN) tablet 1 mg  1 mg Oral Q6H PRN Ursula Alert, MD       Or  . LORazepam (ATIVAN) injection 1 mg  1 mg Intramuscular Q6H PRN Lando Alcalde, MD      . magnesium hydroxide (MILK OF MAGNESIA) suspension 30 mL  30 mL Oral Daily PRN Shaurya Rawdon, MD      . mirtazapine (REMERON) tablet 7.5 mg  7.5 mg Oral QHS Dawsyn Ramsaran, MD      . OLANZapine zydis (ZYPREXA) disintegrating tablet 5 mg  5 mg Oral TID PRN Ursula Alert, MD       Or  . OLANZapine (ZYPREXA) injection 5 mg  5 mg Intramuscular TID PRN Ursula Alert, MD       PTA Medications: No prescriptions prior to admission    Musculoskeletal: Strength & Muscle Tone: within normal limits Gait & Station: normal Patient leans: N/A  Psychiatric Specialty Exam: Physical Exam  Nursing note and vitals reviewed. Constitutional:  I concur with PE done in ED.    Review of Systems  Psychiatric/Behavioral: Positive for depression, suicidal ideas, hallucinations and substance abuse. The patient is nervous/anxious and has insomnia.   All other systems reviewed  and are negative.   Blood pressure 150/95, pulse 65, temperature 97.7 F (36.5 C), temperature source Oral, resp. rate 16, height 6' 1"  (1.854 m), weight 63.05 kg (139 lb), SpO2 100 %.Body mass index is 18.34 kg/(m^2).  General Appearance: Fairly Groomed  Eye Contact:  Fair  Speech:  Clear and Coherent  Volume:  Normal  Mood:  Anxious and Depressed  Affect:  Congruent  Thought Process:  Disorganized and Descriptions of Associations: Circumstantial  Orientation:  Full (Time, Place, and Person)  Thought Content:  Delusions, Hallucinations: Auditory Command:  kill self, Paranoid Ideation and Rumination  Suicidal Thoughts:  Yes.  with intent/plan s/p suicide attempt  Homicidal Thoughts:  No  Memory:  Immediate;   Fair Recent;   Fair Remote;   Fair  Judgement:  Impaired  Insight:  Lacking  Psychomotor Activity:  Decreased and Restlessness  Concentration:  Concentration: Poor and Attention Span: Fair  Recall:  [employer] of Knowledge:  Fair  Language:  Fair  Akathisia:  No  Handed:  Right  AIMS (if indicated):     Assets:  Desire for Improvement  ADL's:  Intact  Cognition:  WNL  Sleep:  Number of Hours: 4       Treatment Plan Summary:Charles Lam is a 30 y.o.AA male who is single , unemployed , currently homeless , has a hx of depression as well as mood swings and cannabis abuse , presented to [facility] voluntarily reporting increasing hallucinations over the past two weeks. Pt with worsening mood sx- will benefit from inpatient stay.   Daily contact with patient to assess and evaluate symptoms and progress in treatment and Medication management   Patient will benefit from inpatient treatment and stabilization.  Estimated length of stay is 5-7 days.  Reviewed past medical records,treatment plan.  Will increase Haldol to 5 mg po bid - for psychosis- initiated in ED. Will continue Cogentin 0.5 mg po bid for EPS. Will start Depakote ER 750 mg po qhs for mood sx. Will get  Depakote level in 5 days. Will add Remeron 7.5 mg po qhs for sleep as well as depression. Will make available PRN medications as per agitation protocol. Will continue to monitor vitals ,medication compliance and treatment side effects while patient is here.  Will monitor  for medical issues as well as call consult as needed.  Reviewed labs ,BAL - 68, uds - Cannabis , cbc - wnl, ekg for qtc wnl ,will get lipid panel, hba1c, pl. Will repeat BMP for hypokalemia - KDUR replaced in ED. CSW will start working on disposition.  Patient to participate in therapeutic milieu .       Observation Level/Precautions:  15 minute checks    Psychotherapy:  Individual and group therapy     Consultations:  Social worker  Discharge Concerns:  Stability/safety       I certify that inpatient services furnished can reasonably be expected to improve the patient's condition.    Harlo Fabela, MD 7/2/20179:13 AM

## 2016-02-05 NOTE — Progress Notes (Signed)
Adult Psychoeducational Group Note  Date:  02/05/2016 Time:  8:15 PM  Group Topic/Focus:  Wrap-Up Group:   The focus of this group is to help patients review their daily goal of treatment and discuss progress on daily workbooks.  Participation Level:  Did Not Attend  Pt did not attend wrap-up group.    Charles NeerJasmine Lam Charles Lam 02/05/2016, 8:26 PM

## 2016-02-06 LAB — BASIC METABOLIC PANEL
Anion gap: 7 (ref 5–15)
BUN: 11 mg/dL (ref 6–20)
CHLORIDE: 104 mmol/L (ref 101–111)
CO2: 30 mmol/L (ref 22–32)
CREATININE: 0.89 mg/dL (ref 0.61–1.24)
Calcium: 8.9 mg/dL (ref 8.9–10.3)
GFR calc Af Amer: >60 mL/min (ref >60)
GLUCOSE: 83 mg/dL (ref 65–99)
POTASSIUM: 3.6 mmol/L (ref 3.5–5.1)
SODIUM: 141 mmol/L (ref 135–145)

## 2016-02-06 LAB — LIPID PANEL
CHOL/HDL RATIO: 2.5 ratio
Cholesterol: 138 mg/dL (ref 0–200)
HDL: 56 mg/dL (ref >40)
LDL Cholesterol: 72 mg/dL (ref 0–99)
Triglycerides: 50 mg/dL (ref <150)
VLDL: 10 mg/dL (ref 0–40)

## 2016-02-06 LAB — TSH: TSH: 0.873 u[IU]/mL (ref 0.350–4.500)

## 2016-02-06 NOTE — Progress Notes (Signed)
Adult Psychoeducational Group Note  Date:  02/06/2016 Time:  8:15 PM  Group Topic/Focus:  Wrap-Up Group:   The focus of this group is to help patients review their daily goal of treatment and discuss progress on daily workbooks.  Participation Level:  Did Not Attend  Pt did not attend wrap-up group. Pt was asleep.    Charles NeerJasmine Lam Charles Lam 02/06/2016, 8:36 PM

## 2016-02-06 NOTE — Progress Notes (Signed)
D. Pt had been in room and in bed for much of the evening, did not attend or participate in any milieu activities. Charles Lam spoke about how he was feeling tired and wanted to get some sleep. He reported some of the same chest wall pain as he had yesterday but reports it does not feel as bad and did not want any medication for pain. He also stated that he did not want to do an EKG this evening and requested it be done in the morning time. Charles Lam received his bedtime medications without incident this evening. A. Support and encouragement provided. R. Safety maintained, will continue to monitor.

## 2016-02-06 NOTE — Tx Team (Signed)
Interdisciplinary Treatment Plan Update (Adult)  Date:  02/06/2016   Time Reviewed:  8:17 AM   Progress in Treatment: Attending groups: Yes. Participating in groups:  Yes. Taking medication as prescribed:  Yes. Tolerating medication:  Yes. Family/Significant other contact made:   Patient understands diagnosis:  Yes  As evidenced by seeking help with Discussing patient identified problems/goals with staff:  Yes, see initial care plan. Medical problems stabilized or resolved:  Yes. Denies suicidal/homicidal ideation: Yes. Issues/concerns per patient self-inventory:  No. Other:  New problem(s) identified:  Discharge Plan or Barriers:  Reason for Continuation of Hospitalization: Depression Hallucinations Medication stabilization  Comments:  Pt today seen as withdrawn, isolative , sad, hopeless, with SI , several plans as well as sleep issues. Pt also reports paranoia that some one is going to hurt him and his family . Pt endorses AH telling him " just do it," " kill ur self ". Pt reports severe mood swings when he is aggressive , labile , destroy property and hurt others. Pt reports this has been going on for a long time. Pt reports he started abusing cannabis since it calms him down. Pt reports regular use of cannabis since he were 30 yrs old.  Pt reports he never got psychiatric help with his sx. Pt reports his girlfriend broke up with him and is moving to [place] and he lost his job 2 weeks ago. Will increase Haldol to 5 mg po bid - for psychosis- initiated in ED. Will continue Cogentin 0.5 mg po bid for EPS. Will start Depakote ER 750 mg po qhs for mood sx. Will get Depakote level in 5 days. Will add Remeron 7.5 mg po qhs for sleep as well as depression.  Estimated length of stay: 4-5 days  New goal(s):  Review of initial/current patient goals per problem list:   Review of initial/current patient goals per problem list:  1. Goal(s): Patient will participate in aftercare plan    Met: Yes   Target date: 3-5 days post admission date   As evidenced by: Patient will participate within aftercare plan AEB aftercare provider and housing plan at discharge being identified. 02/06/16:  Return home, follow up [facility]   2. Goal (s): Patient will exhibit decreased depressive symptoms and suicidal ideations.   Met: No   Target date: 3-5 days post admission date   As evidenced by: Patient will utilize self rating of depression at 3 or below and demonstrate decreased signs of depression or be deemed stable for discharge by MD. 02/06/16:  Denies SI, rates his depression a 5 today  5. Goal(s): Patient will demonstrate decreased signs of psychosis  * Met: No  * Target date: 3-5 days post admission date  * As evidenced by: Patient will demonstrate decreased frequency of AVH or return to baseline function 02/06/16:  C/O AH, was paranoid prior to admission        Attendees: Patient:  02/06/2016 8:17 AM   Family:   02/06/2016 8:17 AM   Physician:  Ursula Alert, MD 02/06/2016 8:17 AM   Nursing:   Hedy Jacob, RN 02/06/2016 8:17 AM   CSW:    Roque Lias, LCSW   02/06/2016 8:17 AM   Other:  02/06/2016 8:17 AM   Other:   02/06/2016 8:17 AM   Other:  Lars Pinks, Nurse CM 02/06/2016 8:17 AM   Other:   02/06/2016 8:17 AM   Other:  Norberto Sorenson, [place]  02/06/2016 8:17 AM   Other:  02/06/2016 8:17 AM  Other:  02/06/2016 8:17 AM   Other:  02/06/2016 8:17 AM   Other:  02/06/2016 8:17 AM   Other:  02/06/2016 8:17 AM   Other:   02/06/2016 8:17 AM    Scribe for Treatment Team:   Trish Mage, 02/06/2016 8:17 AM

## 2016-02-06 NOTE — Progress Notes (Signed)
[facility] MD Progress Note  02/06/2016 1:29 PM Charles Lam  MRN:  [id number] Subjective:  "I'm still hearing voices." Objective:  Charles Lam came in with 41 week old hallucinations worsening.  Hx of depression and cannabis abuse.  He reports that he felt that someone was following him , GF and their son.   Principal Problem: Schizoaffective disorder, bipolar type ([place]) Diagnosis:   Patient Active Problem List   Diagnosis Date Noted  . Schizoaffective disorder, bipolar type ([place]) [F25.0] 02/05/2016  . Alcohol use disorder, mild, abuse [F10.10] 02/05/2016  . Cannabis use disorder, severe, dependence ([place]) [F12.20] 02/05/2016  . Insomnia [G47.00]   . Hallucinations [R44.3]    Total Time spent with patient: 30 minutes  Past Psychiatric History:  See HPI  Past Medical History: History reviewed. No pertinent past medical history. History reviewed. No pertinent past surgical history. Family History:  Family History  Problem Relation Age of Onset  . Bipolar disorder Mother   . Drug abuse Mother   . Bipolar disorder Sister    Family Psychiatric  History:  See HPI Social History:  History  Alcohol Use No     History  Drug Use Not on file    Social History   Social History  . Marital Status: Single    Spouse Name: N/A  . Number of Children: N/A  . Years of Education: N/A   Social History Main Topics  . Smoking status: Current Some Day Smoker -- 0.25 packs/day    Types: Cigarettes  . Smokeless tobacco: None  . Alcohol Use: No  . Drug Use: None  . Sexual Activity: Not Asked   Other Topics Concern  . None   Social History Narrative   Additional Social History:    Pain Medications: See MAR Prescriptions: See MAR Over the Counter: See MAR History of alcohol / drug use?: Yes Longest period of sobriety (when/how long): 9 months Withdrawal Symptoms: Other (Comment) (No history) Name of Substance 1: THC 1 - Age of First Use: 26 1 - Amount (size/oz): a bong 1 -  Frequency: 2x weekly 1 - Last Use / Amount: today                  Sleep: Good  Appetite:  Good  Current Medications: Current Facility-Administered Medications  Medication Dose Route Frequency Provider Last Rate Last Dose  . acetaminophen (TYLENOL) tablet 650 mg  650 mg Oral Q6H PRN Ursula Alert, MD      . alum & mag hydroxide-simeth (MAALOX/MYLANTA) 200-200-20 MG/5ML suspension 30 mL  30 mL Oral Q4H PRN Saramma Eappen, MD      . benztropine (COGENTIN) tablet 0.5 mg  0.5 mg Oral BID Ursula Alert, MD   0.5 mg at 02/06/16 0812  . cloNIDine (CATAPRES) tablet 0.1 mg  0.1 mg Oral BID PRN Ursula Alert, MD      . divalproex (DEPAKOTE ER) 24 hr tablet 750 mg  750 mg Oral QHS Ursula Alert, MD   750 mg at 02/05/16 2111  . feeding supplement (ENSURE ENLIVE) (ENSURE ENLIVE) liquid 237 mL  237 mL Oral BID BM Fernando A Cobos, MD   237 mL at 02/05/16 1000  . haloperidol (HALDOL) tablet 5 mg  5 mg Oral BID Ursula Alert, MD   5 mg at 02/06/16 6553  . ibuprofen (ADVIL,MOTRIN) tablet 600 mg  600 mg Oral Q6H PRN Nanci Pina, FNP   600 mg at 02/06/16 7482  . LORazepam (ATIVAN) tablet 1 mg  1  mg Oral Q6H PRN Ursula Alert, MD       Or  . LORazepam (ATIVAN) injection 1 mg  1 mg Intramuscular Q6H PRN Saramma Eappen, MD      . magnesium hydroxide (MILK OF MAGNESIA) suspension 30 mL  30 mL Oral Daily PRN Ursula Alert, MD      . mirtazapine (REMERON) tablet 7.5 mg  7.5 mg Oral QHS Saramma Eappen, MD   7.5 mg at 02/05/16 2111  . OLANZapine zydis (ZYPREXA) disintegrating tablet 5 mg  5 mg Oral TID PRN Ursula Alert, MD       Or  . OLANZapine (ZYPREXA) injection 5 mg  5 mg Intramuscular TID PRN Ursula Alert, MD      . pantoprazole (PROTONIX) EC tablet 20 mg  20 mg Oral BID AC Nanci Pina, FNP   20 mg at 02/06/16 4034    Lab Results:  Results for orders placed or performed during the hospital encounter of 02/04/16 (from the past 48 hour(s))  Basic metabolic panel     Status: None    Collection Time: 02/06/16  6:15 AM  Result Value Ref Range   Sodium 141 135 - 145 mmol/L   Potassium 3.6 3.5 - 5.1 mmol/L   Chloride 104 101 - 111 mmol/L   CO2 30 22 - 32 mmol/L   Glucose, Bld 83 65 - 99 mg/dL   BUN 11 6 - 20 mg/dL   Creatinine, Ser 0.89 0.61 - 1.24 mg/dL   Calcium 8.9 8.9 - 10.3 mg/dL   GFR calc non Af Amer >60 >60 mL/min   GFR calc Af Amer >60 >60 mL/min    Comment: (NOTE) The eGFR has been calculated using the CKD EPI equation. This calculation has not been validated in all clinical situations. eGFR's persistently <60 mL/min signify possible Chronic Kidney Disease.    Anion gap 7 5 - 15    Comment: Performed at [facility]  Lipid panel     Status: None   Collection Time: 02/06/16  6:15 AM  Result Value Ref Range   Cholesterol 138 0 - 200 mg/dL   Triglycerides 50 <150 mg/dL   HDL 56 >40 mg/dL   Total CHOL/HDL Ratio 2.5 RATIO   VLDL 10 0 - 40 mg/dL   LDL Cholesterol 72 0 - 99 mg/dL    Comment:        Total Cholesterol/HDL:CHD Risk Coronary Heart Disease Risk Table                     Men   Women  1/2 Average Risk   3.4   3.3  Average Risk       5.0   4.4  2 X Average Risk   9.6   7.1  3 X Average Risk  23.4   11.0        Use the calculated Patient Ratio above and the CHD Risk Table to determine the patient's CHD Risk.        ATP III CLASSIFICATION (LDL):  <100     mg/dL   Optimal  100-129  mg/dL   Near or Above                    Optimal  130-159  mg/dL   Borderline  160-189  mg/dL   High  >190     mg/dL   Very High Performed at [facility]   TSH     Status: None  Collection Time: 02/06/16  6:15 AM  Result Value Ref Range   TSH 0.873 0.350 - 4.500 uIU/mL    Comment: Performed at [facility]    Blood Alcohol level:  Lab Results  Component Value Date   ETH 68* 16/38/4536    Metabolic Disorder Labs: No results found for: HGBA1C, MPG No results found for: PROLACTIN Lab Results  Component  Value Date   CHOL 138 02/06/2016   TRIG 50 02/06/2016   HDL 56 02/06/2016   CHOLHDL 2.5 02/06/2016   VLDL 10 02/06/2016   LDLCALC 72 02/06/2016    Physical Findings: AIMS: Facial and Oral Movements Muscles of Facial Expression: None, normal Lips and Perioral Area: None, normal Jaw: None, normal Tongue: None, normal,Extremity Movements Upper (arms, wrists, hands, fingers): None, normal Lower (legs, knees, ankles, toes): None, normal, Trunk Movements Neck, shoulders, hips: None, normal, Overall Severity Severity of abnormal movements (highest score from questions above): None, normal Incapacitation due to abnormal movements: None, normal Patient's awareness of abnormal movements (rate only patient's report): No Awareness, Dental Status Current problems with teeth and/or dentures?: No Does patient usually wear dentures?: No  CIWA:  CIWA-Ar Total: 0 COWS:     Musculoskeletal: Strength & Muscle Tone: within normal limits Gait & Station: normal Patient leans: N/A  Psychiatric Specialty Exam: Physical Exam  ROS  Blood pressure 186/107, pulse 55, temperature 97.8 F (36.6 C), temperature source Oral, resp. rate 16, height _0  (1.854 m), weight 63.05 kg (139 lb), SpO2 100 %.Body mass index is 18.34 kg/(m^2).  General Appearance: Fairly Groomed  Eye Contact:  Fair  Speech:  Clear and Coherent  Volume:  Normal  Mood:  Anxious  Affect:  Congruent  Thought Process:  Disorganized  Orientation:  Full (Time, Place, and Person)  Thought Content:  Delusions and Hallucinations: Auditory  Suicidal Thoughts:  No  Homicidal Thoughts:  No  Memory:  Immediate;   Fair Recent;   Fair Remote;   Fair  Judgement:  Impaired  Insight:  Lacking  Psychomotor Activity:  Decreased and Restlessness  Concentration:  Concentration: Fair and Attention Span: Fair  Recall:  [employer] of Knowledge:  Fair  Language:  Good  Akathisia:  Negative  Handed:  Right  AIMS (if indicated):     Assets:   Desire for Improvement Resilience  ADL's:  Impaired  Cognition:  WNL  Sleep:  Number of Hours: 6.75   Treatment Plan Summary: Review of chart, vital signs, medications, and notes.  1-Individual and group therapy  2-Medication management for depression and anxiety: Medications reviewed with the patient.  Haldol 5 mg psychosis, Depakote ER 750 mg, mood sx.  Remeron 7.5 mg sleep.  She also has Ativan and Zyprexa PRN agitation and CIWA.  3-Coping skills for depression, anxiety  4-Continue crisis stabilization and management  5-Address health issues--monitoring vital signs, stable  6-Treatment plan in progress to prevent relapse of depression and anxiety  Janett Labella, NP Bc 02/06/2016, 1:29 PM Agree with NP progress note as above
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
## 2016-02-06 NOTE — Progress Notes (Signed)
NUTRITION ASSESSMENT  Pt identified as at risk on the Malnutrition Screen Tool  INTERVENTION: 1. Educated patient on the importance of nutrition and encouraged intake of food and beverages. 2. Discussed weight goals. 3. Supplements: continue Ensure Enlive po BID, each supplement provides 350 kcal and 20 grams of protein   NUTRITION DIAGNOSIS: Unintentional weight loss related to sub-optimal intake as evidenced by pt report.   Goal: Pt to meet >/= 90% of their estimated nutrition needs.  Monitor:  PO intake  Assessment:  Pt admitted for SI and hallucinations. Pt was hearing voices for several months PTA and in an attempt to self medicate and stop the voices, pt was smoking marijuana. Pt also admits to banging his head against walls in an attempt to make the voices stop. Pt reports SI with several attempts in the past, including the day of admission. Pt is also recently homeless, per notes. Due to above, he has been feeling depressed with associated lack of appetite and decreased PO intakes.   Unable to elicit weight hx at this time. Ensure Shiela Mayernlive has already been ordered BID which is beneficial given underweight status.  30 y.o. male  Height: Ht Readings from Last 1 Encounters:  02/04/16 6\' 1"  (1.854 m)    Weight: Wt Readings from Last 1 Encounters:  02/04/16 139 lb (63.05 kg)    Weight Hx: Wt Readings from Last 10 Encounters:  02/04/16 139 lb (63.05 kg)  02/04/16 142 lb 1.6 oz (64.456 kg)    BMI:  Body mass index is 18.34 kg/(m^2). Pt meets criteria for underweight based on current BMI.  Estimated Nutritional Needs: Kcal: 25-30 kcal/kg Protein: > 1 gram protein/kg Fluid: 1 ml/kcal  Diet Order: Diet regular Room service appropriate?: Yes; Fluid consistency:: Thin Pt is also offered choice of unit snacks mid-morning and mid-afternoon.  Pt is eating as desired.   Lab results and medications reviewed.      Trenton GammonJessica Lavonne Cass, MS, RD, LDN Inpatient Clinical  Dietitian Pager # [phone] After hours/weekend pager # [phone]

## 2016-02-06 NOTE — [facility] Suicide Risk Assessment (Signed)
[facility] INPATIENT:  Family/Significant Other Suicide Prevention Education  Suicide Prevention Education:  Education Completed; Stevan Bornracy McGee, wife, [phone],  has been identified by the patient as the family member/significant other with whom the patient will be residing, and identified as the person(s) who will aid the patient in the event of a mental health crisis (suicidal ideations/suicide attempt).  With written consent from the patient, the family member/significant other has been provided the following suicide prevention education, prior to the and/or following the discharge of the patient.  The suicide prevention education provided includes the following:  Suicide risk factors  Suicide prevention and interventions  National Suicide Hotline telephone number  [facility] assessment telephone number  [facility] Emergency Assistance 911  [facility] and/or [facility]idential Mobile Crisis [facility] telephone number  Request made of family/significant other to:  Remove weapons (e.g., guns, rifles, knives), all items previously/currently identified as safety concern.    Remove drugs/medications (over-the-counter, prescriptions, illicit drugs), all items previously/currently identified as a safety concern.  The family member/significant other verbalizes understanding of the suicide prevention education information provided.  The family member/significant other agrees to remove the items of safety concern listed above. She stated he will be staying with other family members until she is sure he is following his treatment plan and doing all right.  Daryel Geraldorth, Trishelle Devora B 02/06/2016, 3:59 PM
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
## 2016-02-06 NOTE — Progress Notes (Signed)
D:Affect is flat/blunted. Positive for auditory hallucinations. Pt has been visible in the milieu while attending most of the groups and activities. Has been med compliant and currently denies thoughts of self harm. A:Support and encouragement offered. R:Receptive. No complaints of pain or problems at this time.

## 2016-02-07 LAB — HEMOGLOBIN A1C
HEMOGLOBIN A1C: 4.9 % (ref 4.8–5.6)
MEAN PLASMA GLUCOSE: 94 mg/dL

## 2016-02-07 MED ORDER — HALOPERIDOL 5 MG PO TABS
5.0000 mg | ORAL_TABLET | Freq: Every day | ORAL | Status: DC
  Administered 2016-02-07 – 2016-02-08 (×2): 5 mg via ORAL
  Filled 2016-02-07 (×4): qty 1

## 2016-02-07 MED ORDER — HALOPERIDOL 5 MG PO TABS
5.0000 mg | ORAL_TABLET | Freq: Every day | ORAL | Status: DC
  Administered 2016-02-08 – 2016-02-09 (×2): 5 mg via ORAL
  Filled 2016-02-07: qty 14
  Filled 2016-02-07 (×3): qty 1

## 2016-02-07 MED ORDER — BENZTROPINE MESYLATE 0.5 MG PO TABS
0.5000 mg | ORAL_TABLET | Freq: Every day | ORAL | Status: DC
  Administered 2016-02-07 – 2016-02-08 (×2): 0.5 mg via ORAL
  Filled 2016-02-07 (×4): qty 1

## 2016-02-07 MED ORDER — BENZTROPINE MESYLATE 0.5 MG PO TABS
0.5000 mg | ORAL_TABLET | Freq: Every day | ORAL | Status: DC
  Administered 2016-02-08 – 2016-02-09 (×2): 0.5 mg via ORAL
  Filled 2016-02-07 (×2): qty 1
  Filled 2016-02-07: qty 14
  Filled 2016-02-07: qty 1

## 2016-02-07 MED ORDER — MIRTAZAPINE 15 MG PO TABS
15.0000 mg | ORAL_TABLET | Freq: Every day | ORAL | Status: DC
  Administered 2016-02-07 – 2016-02-08 (×2): 15 mg via ORAL
  Filled 2016-02-07: qty 7
  Filled 2016-02-07 (×4): qty 1

## 2016-02-07 NOTE — [facility] Group Notes (Signed)
[facility] LCSW Group Therapy  02/07/2016 , 11:20 AM   Type of Therapy:  Group Therapy  Participation Level:  Active  Participation Quality:  Attentive  Affect:  Appropriate  Cognitive:  Alert  Insight:  Improving  Engagement in Therapy:  Engaged  Modes of Intervention:  Discussion, Exploration and Socialization  Summary of Progress/Problems: Today's group focused on the term Diagnosis.  Participants were asked to define the term, and then pronounce whether it is a negative, positive or neutral term. Invited.  Chose to not attend.  Charles Lam, Charles Lam 02/07/2016 , 11:20 AM

## 2016-02-07 NOTE — Progress Notes (Signed)
D: Pt was flat, withdrawn and isolated to his room: Pt was in bed with eyes closed for most of the evening. Pt endorsed moderate depression; states "I just have a lot to think about in life." Pt however denied SI at this time. Pt also denied pain, HI, AVH or anxiety. Pt remained calm and cooperative. A: Medications offered as prescribed.  Support, encouragement, and safe environment provided.  15-minute safety checks continue. R: Pt was med compliant.  Pt did not attend wrap-up group. Safety checks continue.

## 2016-02-07 NOTE — Progress Notes (Signed)
DAR NOTE: Patient presents with flat affect and depressed mood.  Denies pain, auditory and visual hallucinations.  Rates depression at 0, hopelessness at 0, and anxiety at 0.  Maintained on routine safety checks.  Medications given as prescribed.  Support and encouragement offered as needed.  Patient remained in his room most of this shift.  Denies any withdrawal symptoms and non observed.  Offered no complaint.

## 2016-02-07 NOTE — Progress Notes (Signed)
Recreation Therapy Notes  Animal-Assisted Activity (AAA) Program Checklist/Progress Notes Patient Eligibility Criteria Checklist & Daily Group note for Rec Tx Intervention  Date: 06.27.2017 Time: 2:45pm [address]    AAA/T Program Assumption of Risk Form signed by Patient/ or Parent Legal Guardian Yes  Patient is free of allergies or sever asthma Yes  Patient reports no fear of animals Yes  Patient reports no history of cruelty to animals Yes  Patient understands his/her participation is voluntary Yes  Behavioral Response: Did not attend.    Clinical Observations/Feedback: Patient discussed with MD for appropriateness in pet therapy session. Both LRT and MD agree patient is appropriate for participation. Patient offered participation in session, but declined at this time. Patient did not attend session.   Marykay Lexenise L Marvion Bastidas, LRT/CTRS        Yuvaan Olander L 02/07/2016 11:29 AM

## 2016-02-07 NOTE — Progress Notes (Signed)
[facility] MD Progress Note  02/07/2016 3:05 PM Charles Lam  MRN:  [id number] Subjective:  "I'm still depressed."  Objective: : Charles Lam is a 30 y.o.AA male who is single , unemployed , currently homeless , has a hx of depression as well as mood swings and cannabis abuse , presented to [facility] voluntarily reporting increasing hallucinations over the past two weeks.  Patient seen and chart reviewed. Pt today seen in bed, eyes closed , limited participation in milieu. Pt continues to be depressed, and has AH. Pt reports sleep issues. Discussed readjusting medications. Will continue to encourage and support.   Principal Problem: Schizoaffective disorder, bipolar type ([place]) Diagnosis:   Patient Active Problem List   Diagnosis Date Noted  . Schizoaffective disorder, bipolar type ([place]) [F25.0] 02/05/2016  . Alcohol use disorder, mild, abuse [F10.10] 02/05/2016  . Cannabis use disorder, severe, dependence ([place]) [F12.20] 02/05/2016  . Insomnia [G47.00]   . Hallucinations [R44.3]    Total Time spent with patient: 25 minutes  Past Psychiatric History:  See HPI  Past Medical History: Please see H&P.  Family History:  Family History  Problem Relation Age of Onset  . Bipolar disorder Mother   . Drug abuse Mother   . Bipolar disorder Sister    Family Psychiatric  History:  See HPI Social History:  History  Alcohol Use No     History  Drug Use Not on file    Social History   Social History  . Marital Status: Single    Spouse Name: N/A  . Number of Children: N/A  . Years of Education: N/A   Social History Main Topics  . Smoking status: Current Some Day Smoker -- 0.25 packs/day    Types: Cigarettes  . Smokeless tobacco: None  . Alcohol Use: No  . Drug Use: None  . Sexual Activity: Not Asked   Other Topics Concern  . None   Social History Narrative   Additional Social History:    Pain Medications: See MAR Prescriptions: See MAR Over the Counter: See MAR History  of alcohol / drug use?: Yes Longest period of sobriety (when/how long): 9 months Withdrawal Symptoms: Other (Comment) (No history) Name of Substance 1: THC 1 - Age of First Use: 26 1 - Amount (size/oz): a bong 1 - Frequency: 2x weekly 1 - Last Use / Amount: today                  Sleep: Poor  Appetite:  Poor  Current Medications: Current Facility-Administered Medications  Medication Dose Route Frequency Provider Last Rate Last Dose  . acetaminophen (TYLENOL) tablet 650 mg  650 mg Oral Q6H PRN Ursula Alert, MD      . alum & mag hydroxide-simeth (MAALOX/MYLANTA) 200-200-20 MG/5ML suspension 30 mL  30 mL Oral Q4H PRN Ursula Alert, MD      . Derrill Memo ON 02/08/2016] benztropine (COGENTIN) tablet 0.5 mg  0.5 mg Oral Daily Serah Nicoletti, MD      . benztropine (COGENTIN) tablet 0.5 mg  0.5 mg Oral QHS Chaze Hruska, MD      . cloNIDine (CATAPRES) tablet 0.1 mg  0.1 mg Oral BID PRN Ursula Alert, MD      . divalproex (DEPAKOTE ER) 24 hr tablet 750 mg  750 mg Oral QHS Heidy Mccubbin, MD   750 mg at 02/06/16 2124  . feeding supplement (ENSURE ENLIVE) (ENSURE ENLIVE) liquid 237 mL  237 mL Oral BID BM Jenne Campus, MD   237 mL  at 02/07/16 0823  . [START ON 02/08/2016] haloperidol (HALDOL) tablet 5 mg  5 mg Oral Daily Marleni Gallardo, MD      . haloperidol (HALDOL) tablet 5 mg  5 mg Oral QHS Jeronda Don, MD      . ibuprofen (ADVIL,MOTRIN) tablet 600 mg  600 mg Oral Q6H PRN Nanci Pina, FNP   600 mg at 02/06/16 8315  . LORazepam (ATIVAN) tablet 1 mg  1 mg Oral Q6H PRN Ursula Alert, MD       Or  . LORazepam (ATIVAN) injection 1 mg  1 mg Intramuscular Q6H PRN Reagen Goates, MD      . magnesium hydroxide (MILK OF MAGNESIA) suspension 30 mL  30 mL Oral Daily PRN Ursula Alert, MD      . mirtazapine (REMERON) tablet 15 mg  15 mg Oral QHS Corran Lalone, MD      . OLANZapine zydis (ZYPREXA) disintegrating tablet 5 mg  5 mg Oral TID PRN Ursula Alert, MD       Or  . OLANZapine  (ZYPREXA) injection 5 mg  5 mg Intramuscular TID PRN Ursula Alert, MD      . pantoprazole (PROTONIX) EC tablet 20 mg  20 mg Oral BID AC Nanci Pina, FNP   20 mg at 02/07/16 1761    Lab Results:  Results for orders placed or performed during the hospital encounter of 02/04/16 (from the past 48 hour(s))  Basic metabolic panel     Status: None   Collection Time: 02/06/16  6:15 AM  Result Value Ref Range   Sodium 141 135 - 145 mmol/L   Potassium 3.6 3.5 - 5.1 mmol/L   Chloride 104 101 - 111 mmol/L   CO2 30 22 - 32 mmol/L   Glucose, Bld 83 65 - 99 mg/dL   BUN 11 6 - 20 mg/dL   Creatinine, Ser 0.89 0.61 - 1.24 mg/dL   Calcium 8.9 8.9 - 10.3 mg/dL   GFR calc non Af Amer >60 >60 mL/min   GFR calc Af Amer >60 >60 mL/min    Comment: (NOTE) The eGFR has been calculated using the CKD EPI equation. This calculation has not been validated in all clinical situations. eGFR's persistently <60 mL/min signify possible Chronic Kidney Disease.    Anion gap 7 5 - 15    Comment: Performed at [facility]  Hemoglobin A1c     Status: None   Collection Time: 02/06/16  6:15 AM  Result Value Ref Range   Hgb A1c MFr Bld 4.9 4.8 - 5.6 %    Comment: (NOTE)         Pre-diabetes: 5.7 - 6.4         Diabetes: >6.4         Glycemic control for adults with diabetes: <7.0    Mean Plasma Glucose 94 mg/dL    Comment: (NOTE) Performed At: [facility] [place], [place] [id number] Lindon Romp MD IR:[id number] Performed at [facility]   Lipid panel     Status: None   Collection Time: 02/06/16  6:15 AM  Result Value Ref Range   Cholesterol 138 0 - 200 mg/dL   Triglycerides 50 <150 mg/dL   HDL 56 >40 mg/dL   Total CHOL/HDL Ratio 2.5 RATIO   VLDL 10 0 - 40 mg/dL   LDL Cholesterol 72 0 - 99 mg/dL    Comment:        Total Cholesterol/HDL:CHD Risk Coronary  Heart Disease Risk Table                     Men   Women  1/2 Average Risk   3.4    3.3  Average Risk       5.0   4.4  2 X Average Risk   9.6   7.1  3 X Average Risk  23.4   11.0        Use the calculated Patient Ratio above and the CHD Risk Table to determine the patient's CHD Risk.        ATP III CLASSIFICATION (LDL):  <100     mg/dL   Optimal  100-129  mg/dL   Near or Above                    Optimal  130-159  mg/dL   Borderline  160-189  mg/dL   High  >190     mg/dL   Very High Performed at [facility]   TSH     Status: None   Collection Time: 02/06/16  6:15 AM  Result Value Ref Range   TSH 0.873 0.350 - 4.500 uIU/mL    Comment: Performed at [facility]    Blood Alcohol level:  Lab Results  Component Value Date   [facility] 68* 30/02/6225    Metabolic Disorder Labs: Lab Results  Component Value Date   HGBA1C 4.9 02/06/2016   MPG 94 02/06/2016   No results found for: PROLACTIN Lab Results  Component Value Date   CHOL 138 02/06/2016   TRIG 50 02/06/2016   HDL 56 02/06/2016   CHOLHDL 2.5 02/06/2016   VLDL 10 02/06/2016   LDLCALC 72 02/06/2016    Physical Findings: AIMS: Facial and Oral Movements Muscles of Facial Expression: None, normal Lips and Perioral Area: None, normal Jaw: None, normal Tongue: None, normal,Extremity Movements Upper (arms, wrists, hands, fingers): None, normal Lower (legs, knees, ankles, toes): None, normal, Trunk Movements Neck, shoulders, hips: None, normal, Overall Severity Severity of abnormal movements (highest score from questions above): None, normal Incapacitation due to abnormal movements: None, normal Patient's awareness of abnormal movements (rate only patient's report): No Awareness, Dental Status Current problems with teeth and/or dentures?: No Does patient usually wear dentures?: No  CIWA:  CIWA-Ar Total: 0 COWS:     Musculoskeletal: Strength & Muscle Tone: within normal limits Gait & Station: normal Patient leans: N/A  Psychiatric Specialty Exam: Physical Exam  Review of  Systems  Psychiatric/Behavioral: Positive for depression, suicidal ideas, hallucinations and substance abuse. The patient is nervous/anxious.   All other systems reviewed and are negative.   Blood pressure 163/84, pulse 55, temperature 97.4 F (36.3 C), temperature source Oral, resp. rate 20, height 6' 1"  (1.854 m), weight 63.05 kg (139 lb), SpO2 100 %.Body mass index is 18.34 kg/(m^2).  General Appearance: Disheveled and Guarded  Eye Contact:  Poor  Speech:  Slow  Volume:  Decreased  Mood:  Depressed and Hopeless  Affect:  Congruent and Depressed  Thought Process:  Disorganized  Orientation:  Full (Time, Place, and Person)  Thought Content:  Delusions and Hallucinations: Auditory  Suicidal Thoughts:  Yes.  with intent/plancontracts for safety on the unit   Homicidal Thoughts:  No  Memory:  Immediate;   Fair Recent;   Fair Remote;   Fair  Judgement:  Impaired  Insight:  Lacking  Psychomotor Activity:  Decreased and Restlessness  Concentration:  Concentration: Fair and  Attention Span: Fair  Recall:  [employer] of Knowledge:  Fair  Language:  Good  Akathisia:  Negative  Handed:  Right  AIMS (if indicated):     Assets:  Desire for Improvement Resilience  ADL's:  Impaired  Cognition:  WNL  Sleep:  Number of Hours: 6.75   Treatment Plan Summary:Sammie S Lieske is a 30 y.o.AA male who is single , unemployed , currently homeless , has a hx of depression as well as mood swings and cannabis abuse , [facility] reporting increasing hallucinations over the past two weeks. Pt continues to be depressed and has sleep issues. Will continue treatment.  Daily contact with patient to assess and evaluate symptoms and progress in treatment and Medication management   Will change Haldol to 5 mg po qam and hs  - for psychosis. Will continue Cogentin 0.5 mg po bid for EPS. Will continue Depakote ER 750 mg po qhs for mood sx. Depakote level on 02/09/16. Will increase Remeron to  15 mg po qhs for sleep as well as depression. Will make available PRN medications as per agitation protocol. Will continue to monitor vitals ,medication compliance and treatment side effects while patient is here.  Will monitor for medical issues as well as call consult as needed.  Reviewed labs -Repeat BMP for hypokalemia -wnl . CSW will continue working on disposition.  Patient to participate in therapeutic milieu .   Kenyon Eichelberger, MD  02/07/2016, 3:05 PM

## 2016-02-08 MED ORDER — AMLODIPINE BESYLATE 5 MG PO TABS
5.0000 mg | ORAL_TABLET | Freq: Every day | ORAL | Status: DC
  Administered 2016-02-08 – 2016-02-09 (×2): 5 mg via ORAL
  Filled 2016-02-08: qty 7
  Filled 2016-02-08 (×4): qty 1

## 2016-02-08 NOTE — Progress Notes (Signed)
D: Pt was flat, withdrawn and isolated to self even while in the dayroom; Pt however, Pt denied any form of depression, anxiety, pain, SI, HI or AVH; states, "I signed the 72 hour paper; I have a few more days to make sure I feel just right and I will be ready to go." Pt remained calm and cooperative. A: Medications offered as prescribed.  Support, encouragement, and safe environment provided.  15-minute safety checks continue. R: Pt was med compliant. Safety checks continue.

## 2016-02-08 NOTE — [facility] Group Notes (Signed)
[facility]  02/08/2016 , 3:59 PM    Type of Therapy:  [facility] Presentation  Participation Level:  Active  Participation Quality:  Attentive  Affect:  Blunted  Cognitive:  Oriented  Insight:  Limited  Engagement in Therapy:  Engaged  Modes of Intervention:  Discussion, Education and Socialization  Summary of Progress/Problems:  [name] from [facility] [employer] [facility] came to present his recovery story and play the guitar.  Stayed the entire time, engaged throughout.  Charles Lam, Charles Lam 02/08/2016 , 3:59 PM

## 2016-02-08 NOTE — Progress Notes (Signed)
[facility] MD Progress Note  02/08/2016 3:33 PM Charles Lam  MRN:  [id number] Subjective:  "I'm ok."   Objective: : Charles Lam is a 30 y.o.AA male who is single , unemployed , currently homeless , has a hx of depression as well as mood swings and cannabis abuse , [facility] reporting increasing hallucinations over the past two weeks.  Patient seen and chart reviewed. Pt today seen in bed,continues to appear depressed. Pt however per staff attended group today and seen as more interactive . Pt is tolerating his medications well, denies ADRs. Will continue to encourage and support.   Principal Problem: Schizoaffective disorder, bipolar type ([facility]) Diagnosis:   Patient Active Problem List   Diagnosis Date Noted  . Schizoaffective disorder, bipolar type ([facility]) [F25.0] 02/05/2016  . Alcohol use disorder, mild, abuse [F10.10] 02/05/2016  . Cannabis use disorder, severe, dependence ([facility]) [F12.20] 02/05/2016  . Insomnia [G47.00]   . Hallucinations [R44.3]    Total Time spent with patient: 25 minutes  Past Psychiatric History:  See HPI  Past Medical History: Please see H&P.  Family History:  Family History  Problem Relation Age of Onset  . Bipolar disorder Mother   . Drug abuse Mother   . Bipolar disorder Sister    Family Psychiatric  History:  See HPI Social History:  History  Alcohol Use No     History  Drug Use Not on file    Social History   Social History  . Marital Status: Single    Spouse Name: N/A  . Number of Children: N/A  . Years of Education: N/A   Social History Main Topics  . Smoking status: Current Some Day Smoker -- 0.25 packs/day    Types: Cigarettes  . Smokeless tobacco: None  . Alcohol Use: No  . Drug Use: None  . Sexual Activity: Not Asked   Other Topics Concern  . None   Social History Narrative   Additional Social History:    Pain Medications: See MAR Prescriptions: See MAR Over the Counter: See MAR History of  alcohol / drug use?: Yes Longest period of sobriety (when/how long): 9 months Withdrawal Symptoms: Other (Comment) (No history) Name of Substance 1: THC 1 - Age of First Use: 26 1 - Amount (size/oz): a bong 1 - Frequency: 2x weekly 1 - Last Use / Amount: today                  Sleep: Fair  Appetite:  Fair  Current Medications: Current Facility-Administered Medications  Medication Dose Route Frequency Provider Last Rate Last Dose  . acetaminophen (TYLENOL) tablet 650 mg  650 mg Oral Q6H PRN Jomarie Longs, MD      . alum & mag hydroxide-simeth (MAALOX/MYLANTA) 200-200-20 MG/5ML suspension 30 mL  30 mL Oral Q4H PRN Catarino Vold, MD      . amLODipine (NORVASC) tablet 5 mg  5 mg Oral Daily Shelma Eiben, MD   5 mg at 02/08/16 1212  . benztropine (COGENTIN) tablet 0.5 mg  0.5 mg Oral Daily Griffin Gerrard, MD   0.5 mg at 02/08/16 0825  . benztropine (COGENTIN) tablet 0.5 mg  0.5 mg Oral QHS Jomarie Longs, MD   0.5 mg at 02/07/16 2133  . cloNIDine (CATAPRES) tablet 0.1 mg  0.1 mg Oral BID PRN Jomarie Longs, MD      . divalproex (DEPAKOTE ER) 24 hr tablet 750 mg  750 mg Oral QHS Yanina Knupp, MD   750 mg at  02/07/16 2131  . feeding supplement (ENSURE ENLIVE) (ENSURE ENLIVE) liquid 237 mL  237 mL Oral BID BM Fernando A Cobos, MD   237 mL at 02/08/16 1000  . haloperidol (HALDOL) tablet 5 mg  5 mg Oral Daily Zackeriah Kissler, MD   5 mg at 02/08/16 0825  . haloperidol (HALDOL) tablet 5 mg  5 mg Oral QHS Jomarie LongsSaramma Jinnifer Montejano, MD   5 mg at 02/07/16 2133  . ibuprofen (ADVIL,MOTRIN) tablet 600 mg  600 mg Oral Q6H PRN Truman Haywardakia S Starkes, FNP   600 mg at 02/06/16 [id number]  . LORazepam (ATIVAN) tablet 1 mg  1 mg Oral Q6H PRN Jomarie LongsSaramma Ponciano Shealy, MD       Or  . LORazepam (ATIVAN) injection 1 mg  1 mg Intramuscular Q6H PRN Effie Janoski, MD      . magnesium hydroxide (MILK OF MAGNESIA) suspension 30 mL  30 mL Oral Daily PRN Jomarie LongsSaramma Ileanna Gemmill, MD      . mirtazapine (REMERON) tablet 15 mg  15 mg Oral QHS Jomarie LongsSaramma  Kamarri Lovvorn, MD   15 mg at 02/07/16 2132  . OLANZapine zydis (ZYPREXA) disintegrating tablet 5 mg  5 mg Oral TID PRN Jomarie LongsSaramma Hanish Laraia, MD       Or  . OLANZapine (ZYPREXA) injection 5 mg  5 mg Intramuscular TID PRN Jomarie LongsSaramma Joshua Zeringue, MD      . pantoprazole (PROTONIX) EC tablet 20 mg  20 mg Oral BID AC Truman Haywardakia S Starkes, FNP   20 mg at 02/08/16 [id number]    Lab Results:  No results found for this or any previous visit (from the past 48 hour(s)).  Blood Alcohol level:  Lab Results  Component Value Date   ETH 68* 02/04/2016    Metabolic Disorder Labs: Lab Results  Component Value Date   HGBA1C 4.9 02/06/2016   MPG 94 02/06/2016   No results found for: PROLACTIN Lab Results  Component Value Date   CHOL 138 02/06/2016   TRIG 50 02/06/2016   HDL 56 02/06/2016   CHOLHDL 2.5 02/06/2016   VLDL 10 02/06/2016   LDLCALC 72 02/06/2016    Physical Findings: AIMS: Facial and Oral Movements Muscles of Facial Expression: None, normal Lips and Perioral Area: None, normal Jaw: None, normal Tongue: None, normal,Extremity Movements Upper (arms, wrists, hands, fingers): None, normal Lower (legs, knees, ankles, toes): None, normal, Trunk Movements Neck, shoulders, hips: None, normal, Overall Severity Severity of abnormal movements (highest score from questions above): None, normal Incapacitation due to abnormal movements: None, normal Patient's awareness of abnormal movements (rate only patient's report): No Awareness, Dental Status Current problems with teeth and/or dentures?: No Does patient usually wear dentures?: No  CIWA:  CIWA-Ar Total: 0 COWS:     Musculoskeletal: Strength & Muscle Tone: within normal limits Gait & Station: normal Patient leans: N/A  Psychiatric Specialty Exam: Physical Exam  Review of Systems  Psychiatric/Behavioral: Positive for depression, hallucinations and substance abuse. The patient is nervous/anxious.   All other systems reviewed and are negative.   Blood pressure  160/105, pulse 64, temperature 97.8 F (36.6 C), temperature source Oral, resp. rate 18, height 6\' 1"  (1.854 m), weight 63.05 kg (139 lb), SpO2 100 %.Body mass index is 18.34 kg/(m^2).  General Appearance: Guarded  Eye Contact:  Poor  Speech:  Slow  Volume:  Decreased  Mood:  Depressed improving  Affect:  Congruent and Depressed  Thought Process:  Goal Directed and Descriptions of Associations: Intact  Orientation:  Full (Time, Place, and Person)  Thought Content:  Delusions and Hallucinations: Auditoryimproving  Suicidal Thoughts:  Nocontracts for safety on the unit   Homicidal Thoughts:  No  Memory:  Immediate;   Fair Recent;   Fair Remote;   Fair  Judgement:  Impaired  Insight:  Lacking  Psychomotor Activity:  Decreased and Restlessness  Concentration:  Concentration: Fair and Attention Span: Fair  Recall:  [employer]  Fund of Knowledge:  Fair  Language:  Good  Akathisia:  Negative  Handed:  Right  AIMS (if indicated):     Assets:  Desire for Improvement Resilience  ADL's:  Impaired  Cognition:  WNL  Sleep:  Number of Hours: 6.75   Treatment Plan Summary:Charles Lam is a 30 y.o.AA male who is single , unemployed , currently homeless , has a hx of depression as well as mood swings and cannabis abuse , [facility] reporting increasing hallucinations over the past two weeks. Pt continues to be depressed , although progressing . Will continue treatment.  Daily contact with patient to assess and evaluate symptoms and progress in treatment and Medication management   Will continue Haldol to 5 mg po qam and hs  - for psychosis. Will continue Cogentin 0.5 mg po bid for EPS. Will continue Depakote ER 750 mg po qhs for mood sx. Depakote level on 02/09/16. Increased Remeron to 15 mg po qhs for sleep as well as depression. Will make available PRN medications as per agitation protocol. Will continue to monitor vitals ,medication compliance and treatment side effects  while patient is here.  Will monitor for medical issues as well as call consult as needed.  CSW will continue working on disposition.  Patient to participate in therapeutic milieu .   Erendida Wrenn, MD  02/08/2016, 3:33 PM

## 2016-02-08 NOTE — Progress Notes (Signed)
DAR NOTE: Patient presents with anxious affect and depressed mood.  Denies pain, auditory and visual hallucinations.  Rates depression at 0, hopelessness at 0, and anxiety at 0.  Maintained on routine safety checks.  Medications given as prescribed.  Support and encouragement offered as needed.  Attended group and participated.  States goal for today is "work on myself."  Patient remained isolative to his room.  Offered no complaint.

## 2016-02-09 LAB — VALPROIC ACID LEVEL: Valproic Acid Lvl: 64 ug/mL (ref 50.0–100.0)

## 2016-02-09 MED ORDER — DIVALPROEX SODIUM ER 250 MG PO TB24: 750.0000 mg | ORAL_TABLET | Freq: Every day | ORAL | Status: AC

## 2016-02-09 MED ORDER — MIRTAZAPINE 15 MG PO TABS: 15.0000 mg | ORAL_TABLET | Freq: Every day | ORAL | Status: AC

## 2016-02-09 MED ORDER — AMLODIPINE BESYLATE 5 MG PO TABS: 5.0000 mg | ORAL_TABLET | Freq: Every day | ORAL | Status: AC

## 2016-02-09 MED ORDER — CLONIDINE HCL 0.1 MG PO TABS: 0.1000 mg | ORAL_TABLET | Freq: Two times a day (BID) | ORAL | Status: DC | PRN

## 2016-02-09 MED ORDER — BENZTROPINE MESYLATE 0.5 MG PO TABS: ORAL_TABLET | ORAL | Status: AC

## 2016-02-09 MED ORDER — DIVALPROEX SODIUM ER 250 MG PO TB24
750.0000 mg | ORAL_TABLET | Freq: Every day | ORAL | Status: DC
  Filled 2016-02-09: qty 21

## 2016-02-09 MED ORDER — PANTOPRAZOLE SODIUM 20 MG PO TBEC: 20.0000 mg | DELAYED_RELEASE_TABLET | Freq: Two times a day (BID) | ORAL | Status: AC

## 2016-02-09 MED ORDER — IBUPROFEN 600 MG PO TABS: 600.0000 mg | ORAL_TABLET | Freq: Four times a day (QID) | ORAL | Status: DC | PRN

## 2016-02-09 MED ORDER — HALOPERIDOL 5 MG PO TABS: ORAL_TABLET | ORAL | Status: AC

## 2016-02-09 NOTE — Progress Notes (Signed)
D: Pt was flat, withdrawn and isolated to self even while in the dayroom; Pt however, denied any form of depression, anxiety, pain, SI, HI or AVH; states, "I even feel better today than I felt yesterday; I know I'm ready to go now." Pt remained calm and cooperative. A: Medications offered as prescribed.  Support, encouragement, and safe environment provided.  15-minute safety checks continue. R: Pt was med compliant. Pt attended wrap-up group. Safety checks continue.

## 2016-02-09 NOTE — [facility] Suicide Risk Assessment (Signed)
[facility]   Principal Problem: Schizoaffective disorder, bipolar type [facility](HCC) Discharge Diagnoses:  Patient Active Problem List   Diagnosis Date Noted  . Schizoaffective disorder, bipolar type (HCC) [F25.0] 02/05/2016  . Alcohol use disorder, mild, abuse [F10.10] 02/05/2016  . Cannabis use disorder, severe, dependence (HCC) [F12.20] 02/05/2016  . Insomnia [G47.00]   . Hallucinations [R44.3]     Total Time spent with patient: 30 minutes  Musculoskeletal: Strength & Muscle Tone: within normal limits Gait & Station: normal Patient leans: N/A  Psychiatric Specialty Exam: Review of Systems  Psychiatric/Behavioral: Negative for depression, suicidal ideas, hallucinations and substance abuse.  All other systems reviewed and are negative.   Blood pressure 136/100, pulse 67, temperature 98.9 F (37.2 C), temperature source Oral, resp. rate 20, height 6\' 1"  (1.854 m), weight 63.05 kg (139 lb), SpO2 100 %.Body mass index is 18.34 kg/(m^2).  General Appearance: Fairly Groomed  Patent attorneyye Contact::  Fair  Speech:  Clear and Coherent409  Volume:  Normal  Mood:  Euthymic  Affect:  Congruent  Thought Process:  Goal Directed and Descriptions of Associations: Intact  Orientation:  Full (Time, Place, and Person)  Thought Content:  Logical  Suicidal Thoughts:  No  Homicidal Thoughts:  No  Memory:  Immediate;   Fair Recent;   Fair Remote;   Fair  Judgement:  Fair  Insight:  Fair  Psychomotor Activity:  Normal  Concentration:  Fair  Recall:  [employer]  Fund of Knowledge:Fair  Language: Fair  Akathisia:  No  Handed:  Right  AIMS (if indicated):     Assets:  Communication Skills Desire for Improvement  Sleep:  Number of Hours: 6.5  Cognition: WNL  ADL's:  Intact   Mental Status Per Nursing Assessment::   On Admission:  Suicidal ideation indicated by patient  Demographic Factors:  Male and Low socioeconomic status  Loss Factors: NA  Historical  Factors: Impulsivity  Risk Reduction Factors:   Positive social support  Continued Clinical Symptoms:  Alcohol/Substance Abuse/Dependencies Previous Psychiatric Diagnoses and Treatments  Cognitive Features That Contribute To Risk:  None    Suicide Risk:  Minimal: No identifiable suicidal ideation.  Patients presenting with no risk factors but with morbid ruminations; may be classified as minimal risk based on the severity of the depressive symptoms  Follow-up Information    [facility]   Specialty:  Behavioral Health   Why:  go to the walk-in clinic M-F between 8 and 10AM for your hospital follow up appointment   Contact information:   [address] [place] [address] [phone]       Plan Of Care/Follow-up recommendations:  Activity:  no restrictions Diet:  regular Tests:  as needed Other:  follow up with aftercare  Labrina Lines, MD 02/09/2016, 9:33 AM

## 2016-02-09 NOTE — Tx Team (Signed)
Interdisciplinary Treatment Plan Update (Adult)  Date:  02/09/2016   Time Reviewed:  8:31 AM   Progress in Treatment: Attending groups: Yes. Participating in groups:  Yes. Taking medication as prescribed:  Yes. Tolerating medication:  Yes. Family/Significant other contact made:  Yes Patient understands diagnosis:  Yes  As evidenced by seeking help with depression Discussing patient identified problems/goals with staff:  Yes, see initial care plan. Medical problems stabilized or resolved:  Yes. Denies suicidal/homicidal ideation: Yes. Issues/concerns per patient self-inventory:  No. Other:  New problem(s) identified:  Discharge Plan or Barriers: see below  Reason for Continuation of Hospitalization:   Comments:  Pt today seen as withdrawn, isolative , sad, hopeless, with SI , several plans as well as sleep issues. Pt also reports paranoia that some one is going to hurt him and his family . Pt endorses AH telling him " just do it," " kill ur self ". Pt reports severe mood swings when he is aggressive , labile , destroy property and hurt others. Pt reports this has been going on for a long time. Pt reports he started abusing cannabis since it calms him down. Pt reports regular use of cannabis since he were 30 yrs old.  Pt reports he never got psychiatric help with his sx. Pt reports his girlfriend broke up with him and is moving to [place] and he lost his job 2 weeks ago. Will increase Haldol to 5 mg po bid - for psychosis- initiated in ED. Will continue Cogentin 0.5 mg po bid for EPS. Will start Depakote ER 750 mg po qhs for mood sx. Will get Depakote level in 5 days. Will add Remeron 7.5 mg po qhs for sleep as well as depression.  Estimated length of stay: D/C today  New goal(s):  Review of initial/current patient goals per problem list:   Review of initial/current patient goals per problem list:  1. Goal(s): Patient will participate in aftercare plan   Met: Yes   Target date:  3-5 days post admission date   As evidenced by: Patient will participate within aftercare plan AEB aftercare provider and housing plan at discharge being identified. 02/06/16:  Return home, follow up [facility]   2. Goal (s): Patient will exhibit decreased depressive symptoms and suicidal ideations.   Met: Yes   Target date: 3-5 days post admission date   As evidenced by: Patient will utilize self rating of depression at 3 or below and demonstrate decreased signs of depression or be deemed stable for discharge by MD. 02/06/16:  Denies SI, rates his depression a 5 today 02/09/16:  Denies depression today  5. Goal(s): Patient will demonstrate decreased signs of psychosis  * Met: Yes  * Target date: 3-5 days post admission date  * As evidenced by: Patient will demonstrate decreased frequency of AVH or return to baseline function 02/06/16:  C/O AH, was paranoid prior to admission 02/09/16:  No signs nor symptoms of psychosis today        Attendees: Patient:  02/09/2016 8:31 AM   Family:   02/09/2016 8:31 AM   Physician:  Ursula Alert, MD 02/09/2016 8:31 AM   Nursing:   Hedy Jacob, RN 02/09/2016 8:31 AM   CSW:    Roque Lias, LCSW   02/09/2016 8:31 AM   Other:  02/09/2016 8:31 AM   Other:   02/09/2016 8:31 AM   Other:  Lars Pinks, Nurse CM 02/09/2016 8:31 AM   Other:   02/09/2016 8:31 AM   Other:  Earl Many  Angela Adam [facility]  02/09/2016 8:31 AM   Other:  02/09/2016 8:31 AM   Other:  02/09/2016 8:31 AM   Other:  02/09/2016 8:31 AM   Other:  02/09/2016 8:31 AM   Other:  02/09/2016 8:31 AM   Other:   02/09/2016 8:31 AM    Scribe for Treatment Team:   Trish Mage, 02/09/2016 8:31 AM

## 2016-02-09 NOTE — Progress Notes (Signed)
Patient ID: Charles Lam, male   DOB: [birth year], 30 y.o.   MRN: [id number] Patient denies paranoia, suicidal ideations, and AVH.  Patient was able to verbalize understanding of discharge plans and needs for taking medications upon discharge.  Patient leaving in bright spirits. Patient able to contract for safety.

## 2016-02-09 NOTE — Discharge Summary (Signed)
Physician Discharge Summary Note  Patient:  Charles Lam is an 30 y.o., male MRN:  [id number] DOB:  [birth year] Patient phone:  [phone] (home)  Patient address:   [address] [place] [address],  Total Time spent with patient: Greater than 30 minutes  Date of Admission:  02/04/2016  Date of Discharge: 02-09-16  Reason for Admission: Worsening symptoms of Schizoaffective disorder  Principal Problem: Schizoaffective disorder, bipolar type (HCC), Alcohol use disorder, mild, abuse, Cannabis use disorder, severe, dependence   Discharge Diagnoses: Patient Active Problem List   Diagnosis Date Noted  . Schizoaffective disorder, bipolar type (HCC) [F25.0] 02/05/2016  . Alcohol use disorder, mild, abuse [F10.10] 02/05/2016  . Cannabis use disorder, severe, dependence (HCC) [F12.20] 02/05/2016  . Insomnia [G47.00]   . Hallucinations [R44.3]    Past Psychiatric History: Schizoaffective disorder  Past Medical History: History reviewed. No pertinent past medical history. History reviewed. No pertinent past surgical history.  Family History:  Family History  Problem Relation Age of Onset  . Bipolar disorder Mother   . Drug abuse Mother   . Bipolar disorder Sister    Family Psychiatric  History: See H&P  Social History:  History  Alcohol Use No     History  Drug Use Not on file    Social History   Social History  . Marital Status: Single    Spouse Name: N/A  . Number of Children: N/A  . Years of Education: N/A   Social History Main Topics  . Smoking status: Current Some Day Smoker -- 0.25 packs/day    Types: Cigarettes  . Smokeless tobacco: None  . Alcohol Use: No  . Drug Use: None  . Sexual Activity: Not Asked   Other Topics Concern  . None   Social History Narrative   Hospital Course: Charles Lam is a 30 y.o.AA male who is single, unemployed, currently homeless, has a hx of depression as well as mood swings and cannabis abuse, presented to [facility]  voluntarily reporting increasing hallucinations over the past two weeks.  Upon his arrival & admision to the adult unit, Charles Lam was evaluated & his presenting symptoms identified. His UDS test reports was positive for THC & BAL 68 per toxicology test reports. However, he was not presenting with substance withdrawal symptoms. As a result, he did not receive any substance detoxification treatments. He was in need of mood stabilization treatments. The medication management for the presenting symptoms were discussed & initiated targeting those symptoms. He was medicated & discharged on; Cogentin 0.5 mg for prevention of EPS, Depakote ER 750 mg for mood stabilization, Haldol 5 mg for mood control & Mirtazapine 15 mg for depression/insomnia. He was enrolled in the group counseling sessions & encouraged to participate in the unit programming. His other pre-existing medical problems were identified & his home medications restarted accordingly.   As his treatment continued, Charles Lam was evaluated on daily basis by the clinical providers to assure his response to his treatment regimen.His mental health improvement was noted as evidenced by his report of decreasing symptoms, improved mood, presentation of good affect, medication tolerance & active participation in the unit programming. He was encouraged to update his providers on his progress by daily completion of a self inventory assessment, noting mood, mental status, pain, any new symptoms, anxiety and or concerns.  Charles Lam's symptoms responded well to his treatment regimen combined with a therapeutic and supportive environment. He was motivated for recovery as evidenced by a positive/appropriate behavior and his interaction with  the staff & fellow patients.He also worked closely with the treatment team and case manager to develop a discharge plan with appropriate goals to maintain mood stability after discharge. Coping skills, problem solving as well as  relaxation therapies were also part of the unit programming.  Upon discharge, Charles Lam was in much improved condition than upon admission.His symptoms were reported as significantly decreased or resolved completely. He adamantly denies any SI/HI,  AVH, delusional thoughts & or paranoia. He was motivated to continue taking medication with a goal of continued improvement in mental health. He will continue psychiatric care on an outpatient basis as noted below. He is provided with all the necessary information required to make these appointments without problems. Charles Lam received a 7 days worth, supply samples of [facility]. [facility] belongings in no apparent distress. Transportation per family.   Physical Findings: AIMS: Facial and Oral Movements Muscles of Facial Expression: None, normal Lips and Perioral Area: None, normal Jaw: None, normal Tongue: None, normal,Extremity Movements Upper (arms, wrists, hands, fingers): None, normal Lower (legs, knees, ankles, toes): None, normal, Trunk Movements Neck, shoulders, hips: None, normal, Overall Severity Severity of abnormal movements (highest score from questions above): None, normal Incapacitation due to abnormal movements: None, normal Patient's awareness of abnormal movements (rate only patient's report): No Awareness, Dental Status Current problems with teeth and/or dentures?: No Does patient usually wear dentures?: No  CIWA:  CIWA-Ar Total: 0 COWS:     Musculoskeletal: Strength & Muscle Tone: within normal limits Gait & Station: normal Patient leans: N/A  Psychiatric Specialty Exam: Physical Exam  Constitutional: He appears well-developed.  HENT:  Head: Normocephalic.  Eyes: Pupils are equal, round, and reactive to light.  Neck: Normal range of motion.  Cardiovascular: Normal rate.   Respiratory: Effort normal.  Genitourinary:  Denies any issues in this area  Musculoskeletal: Normal range  of motion.  Neurological: He is alert.  Skin: Skin is warm and dry.    Review of Systems  Constitutional: Negative.   HENT: Negative.   Eyes: Negative.   Respiratory: Negative.   Cardiovascular: Negative.   Gastrointestinal: Negative.   Genitourinary: Negative.   Musculoskeletal: Negative.   Skin: Negative.   Neurological: Negative.   Endo/Heme/Allergies: Negative.   Psychiatric/Behavioral: Positive for depression (Stable) and substance abuse (Alcohol/Cannabis use disorder). Negative for suicidal ideas, hallucinations and memory loss. The patient has insomnia (Stable). The patient is not nervous/anxious.     Blood pressure 136/100, pulse 67, temperature 98.9 F (37.2 C), temperature source Oral, resp. rate 20, height 6\' 1"  (1.854 m), weight 63.05 kg (139 lb), SpO2 100 %.Body mass index is 18.34 kg/(m^2).  See Md's SRA   Have you used any form of tobacco in the last 30 days? (Cigarettes, Smokeless Tobacco, Cigars, and/or Pipes): Yes  Has this patient used any form of tobacco in the last 30 days? (Cigarettes, Smokeless Tobacco, Cigars, and/or Pipes): No  Blood Alcohol level:  Lab Results  Component Value Date   ETH 68* 02/04/2016   Metabolic Disorder Labs:  Lab Results  Component Value Date   HGBA1C 4.9 02/06/2016   MPG 94 02/06/2016   No results found for: PROLACTIN Lab Results  Component Value Date   CHOL 138 02/06/2016   TRIG 50 02/06/2016   HDL 56 02/06/2016   CHOLHDL 2.5 02/06/2016   VLDL 10 02/06/2016   LDLCALC 72 02/06/2016   See Psychiatric Specialty Exam and Suicide Risk Assessment completed by Attending Physician prior to  discharge.  Discharge destination:  Home  Is patient on multiple antipsychotic therapies at discharge:  No   Has Patient had three or more failed trials of antipsychotic monotherapy by history:  No  Recommended Plan for Multiple Antipsychotic Therapies: NA     Discharge Instructions    Discharge instructions    Complete by:  As  directed   Follow-up with your primary care doctor for your blood pressure issues.            Medication List    TAKE these medications      Indication   amLODipine 5 MG tablet  Commonly known as:  NORVASC  Take 1 tablet (5 mg total) by mouth daily. For high blood pressure   Indication:  High Blood Pressure     benztropine 0.5 MG tablet  Commonly known as:  COGENTIN  Take 1 tablet in the morning & at bedtime: For prevention of drug induced tremors   Indication:  Extrapyramidal Reaction caused by Medications     divalproex 250 MG 24 hr tablet  Commonly known as:  DEPAKOTE ER  Take 3 tablets (750 mg total) by mouth at bedtime. For mood stabilization   Indication:  Mood stabilization     haloperidol 5 MG tablet  Commonly known as:  HALDOL  Take 1 tablet (5 mg) in the morning & at bedtime: For mood control   Indication:  Mood control     ibuprofen 600 MG tablet  Commonly known as:  ADVIL,MOTRIN  Take 1 tablet (600 mg total) by mouth every 6 (six) hours as needed for moderate pain (pericarditis possible).   Indication:  Pain     mirtazapine 15 MG tablet  Commonly known as:  REMERON  Take 1 tablet (15 mg total) by mouth at bedtime. For depression/insomnia   Indication:  Trouble Sleeping, Major Depressive Disorder     pantoprazole 20 MG tablet  Commonly known as:  PROTONIX  Take 1 tablet (20 mg total) by mouth 2 (two) times daily before a meal. For acid reflux   Indication:  Conditions of Excess Stomach Acid Secretion       Follow-up Information    [facility]   Specialty:  Behavioral Health   Why:  go to the walk-in clinic M-F between 8 and 10AM for your hospital follow up appointment   Contact information:   [address] [place] [address] [phone]      Follow-up recommendations: Activity:  As tolerated Diet: As recommended by your primary care doctor. Keep all scheduled follow-up appointments as recommended.   Comments: Patient is  instructed prior to discharge to: Take all medications as prescribed by his/her mental healthcare provider. Report any adverse effects and or reactions from the medicines to his/her outpatient provider promptly. Patient has been instructed & cautioned: To not engage in alcohol and or illegal drug use while on prescription medicines. In the event of worsening symptoms, patient is instructed to call the crisis hotline, 911 and or go to the nearest ED for appropriate evaluation and treatment of symptoms. To follow-up with his/her primary care provider for your other medical issues, concerns and or health care needs.   Signed: Sanjuana KavaNwoko, Agnes I, NP, PMHNP, FNP-BC 02/09/2016, 3:43 PM

## 2016-02-09 NOTE — Progress Notes (Signed)
  [facility] Discharge Plan :  Will you be returning to the same living situation after discharge:  Yes,  home At discharge, do you have transportation home?: Yes,  family Do you have the ability to pay for your medications: Yes,  mental health  Release of information consent forms completed and in the chart;  Patient's signature needed at discharge.  Patient to Follow up at: Follow-up Information    [facility]   Specialty:  Behavioral Health   Why:  go to the walk-in clinic M-F between 8 and 10AM for your hospital follow up appointment   Contact information:   [address] [place] [address] [phone]       Next level of care provider has access to [facility] Link:no  Safety Planning and Suicide Prevention discussed: Yes,  yes  Have you used any form of tobacco in the last 30 days? (Cigarettes, Smokeless Tobacco, Cigars, and/or Pipes): Yes  Has patient been referred to the Quitline?: Patient refused referral  Patient has been referred for addiction treatment: Yes  Daryel Geraldorth, Jamison Soward B 02/09/2016, 9:56 AM

## 2016-11-27 ENCOUNTER — Emergency Department (HOSPITAL_COMMUNITY): Payer: Self-pay

## 2016-11-27 ENCOUNTER — Emergency Department (HOSPITAL_COMMUNITY)
Admission: EM | Admit: 2016-11-27 | Discharge: 2016-11-27 | Disposition: A | Discharge status: Home / Self Care | Payer: Self-pay | Attending: Emergency Medicine | Admitting: Emergency Medicine

## 2016-11-27 ENCOUNTER — Encounter (HOSPITAL_COMMUNITY): Payer: Self-pay | Admitting: Emergency Medicine

## 2016-11-27 DIAGNOSIS — S6991XA Unspecified injury of right wrist, hand and finger(s), initial encounter: Secondary | ICD-10-CM

## 2016-11-27 DIAGNOSIS — F17210 Nicotine dependence, cigarettes, uncomplicated: Secondary | ICD-10-CM | POA: Insufficient documentation

## 2016-11-27 DIAGNOSIS — W231XXA Caught, crushed, jammed, or pinched between stationary objects, initial encounter: Secondary | ICD-10-CM | POA: Insufficient documentation

## 2016-11-27 DIAGNOSIS — S60511A Abrasion of right hand, initial encounter: Secondary | ICD-10-CM | POA: Insufficient documentation

## 2016-11-27 DIAGNOSIS — S60412A Abrasion of right middle finger, initial encounter: Secondary | ICD-10-CM | POA: Insufficient documentation

## 2016-11-27 DIAGNOSIS — Y929 Unspecified place or not applicable: Secondary | ICD-10-CM | POA: Insufficient documentation

## 2016-11-27 DIAGNOSIS — Z79899 Other long term (current) drug therapy: Secondary | ICD-10-CM | POA: Insufficient documentation

## 2016-11-27 DIAGNOSIS — Y939 Activity, unspecified: Secondary | ICD-10-CM | POA: Insufficient documentation

## 2016-11-27 DIAGNOSIS — Y999 Unspecified external cause status: Secondary | ICD-10-CM | POA: Insufficient documentation

## 2016-11-27 HISTORY — DX: Bipolar disorder, unspecified: F31.9

## 2016-11-27 HISTORY — DX: Major depressive disorder, single episode, unspecified: F32.9

## 2016-11-27 HISTORY — DX: Depression, unspecified: F32.A

## 2016-11-27 MED ORDER — CEPHALEXIN 500 MG PO CAPS: 500.0000 mg | ORAL_CAPSULE | Freq: Four times a day (QID) | ORAL | 0 refills | Status: DC

## 2016-11-27 MED ORDER — DICLOFENAC SODIUM 50 MG PO TBEC: 50.0000 mg | DELAYED_RELEASE_TABLET | Freq: Two times a day (BID) | ORAL | 0 refills | Status: AC

## 2016-11-27 MED ORDER — BACITRACIN ZINC 500 UNIT/GM EX OINT
TOPICAL_OINTMENT | Freq: Two times a day (BID) | CUTANEOUS | Status: DC
  Administered 2016-11-27: 1 via TOPICAL

## 2016-11-27 NOTE — ED Triage Notes (Signed)
Pt c/o 7/10 right hand pain after got injure with a door on his house, some swollen and lacerations noticed on right hand.

## 2016-11-27 NOTE — ED Provider Notes (Signed)
[facility]-EMERGENCY DEPT Provider Note   CSN: [id number] Arrival date & time: 11/27/16  2105  By signing my name below, I, Teofilo Pod, attest that this documentation has been prepared under the direction and in the presence of Kerrie Buffalo, NP. Electronically Signed: Teofilo Pod, ED Scribe. 11/27/2016. 10:46 PM.    History   Chief Complaint Chief Complaint  Patient presents with  . Hand Injury    The history is provided by the patient. No language interpreter was used.   HPI Comments:  Charles Lam is a 31 y.o. male who presents to the Emergency Department s/p right hand injury that occurred at 1830 today. Pt reports that he slammed his hand in a steel door. He complains of pain to the right hand, and notes several laceration. Bleeding is controlled with a pressure dressing. Denies numbness.   Past Medical History:  Diagnosis Date  . Bipolar 1 disorder (HCC)   . Depression     Patient Active Problem List   Diagnosis Date Noted  . Schizoaffective disorder, bipolar type (HCC) 02/05/2016  . Alcohol use disorder, mild, abuse 02/05/2016  . Cannabis use disorder, severe, dependence (HCC) 02/05/2016  . Insomnia   . Hallucinations     History reviewed. No pertinent surgical history.     Home Medications    Prior to Admission medications   Medication Sig Start Date End Date Taking? Authorizing Provider  amLODipine (NORVASC) 5 MG tablet Take 1 tablet (5 mg total) by mouth daily. For high blood pressure 02/09/16   Sanjuana Kava, NP  benztropine (COGENTIN) 0.5 MG tablet Take 1 tablet in the morning & at bedtime: For prevention of drug induced tremors 02/09/16   Sanjuana Kava, NP  cephALEXin (KEFLEX) 500 MG capsule Take 1 capsule (500 mg total) by mouth 4 (four) times daily. 11/27/16   Breelynn Bankert Orlene Och, NP  diclofenac (VOLTAREN) 50 MG EC tablet Take 1 tablet (50 mg total) by mouth 2 (two) times daily. 11/27/16   Taresa Montville Orlene Och, NP  divalproex (DEPAKOTE ER) 250 MG 24 hr tablet  Take 3 tablets (750 mg total) by mouth at bedtime. For mood stabilization 02/09/16   Sanjuana Kava, NP  haloperidol (HALDOL) 5 MG tablet Take 1 tablet (5 mg) in the morning & at bedtime: For mood control 02/09/16   Sanjuana Kava, NP  ibuprofen (ADVIL,MOTRIN) 600 MG tablet Take 1 tablet (600 mg total) by mouth every 6 (six) hours as needed for moderate pain (pericarditis possible). 02/09/16   Sanjuana Kava, NP  mirtazapine (REMERON) 15 MG tablet Take 1 tablet (15 mg total) by mouth at bedtime. For depression/insomnia 02/09/16   Sanjuana Kava, NP  pantoprazole (PROTONIX) 20 MG tablet Take 1 tablet (20 mg total) by mouth 2 (two) times daily before a meal. For acid reflux 02/09/16   Sanjuana Kava, NP    Family History Family History  Problem Relation Age of Onset  . Bipolar disorder Mother   . Drug abuse Mother   . Bipolar disorder Sister     Social History Social History  Substance Use Topics  . Smoking status: Current Some Day Smoker    Packs/day: 0.25    Types: Cigarettes  . Smokeless tobacco: Never Used  . Alcohol use No     Allergies   Patient has no known allergies.   Review of Systems Review of Systems  Constitutional: Negative for diaphoresis.  Gastrointestinal: Negative for nausea.  Musculoskeletal: Positive for arthralgias and  joint swelling.  Skin: Positive for wound.  Neurological: Negative for numbness.  Psychiatric/Behavioral: The patient is not nervous/anxious.      Physical Exam Updated Vital Signs BP (!) 168/106   Pulse (!) 53   Temp 98.4 F (36.9 C) (Oral)   Resp 16   Ht  (1.88 m)   Wt 68 kg   SpO2 100%   BMI 19.26 kg/m   Physical Exam  Constitutional: He appears well-developed and well-nourished. No distress.  HENT:  Head: Normocephalic and atraumatic.  Eyes: Conjunctivae are normal.  Cardiovascular: Normal rate, regular rhythm and intact distal pulses.   Adequate circulation.  Pulmonary/Chest: Effort normal.  Abdominal: He exhibits no  distension.  Musculoskeletal:       Right hand: He exhibits tenderness. He exhibits normal range of motion. Normal sensation noted. Normal strength noted. He exhibits no thumb/finger opposition.  TTP over right 3rd-5th PIP joints.  Neurological: He is alert.  Skin: Skin is warm and dry.  Abrasions to right middle, ring and small fingers, bleeding controlled.   Psychiatric: He has a normal mood and affect.  Nursing note and vitals reviewed.    ED Treatments / Results  DIAGNOSTIC STUDIES:  Oxygen Saturation is 99% on RA, normal by my interpretation.    COORDINATION OF CARE:  10:41 PM Discussed treatment plan with pt at bedside and pt agreed to plan.   Labs (all labs ordered are listed, but only abnormal results are displayed) Labs Reviewed - No data to display  Radiology Dg Hand Complete Right  Result Date: 11/27/2016 CLINICAL DATA:  Right hand pain after steel door slammed onto the right hand. Pain and swelling over the third, fourth and fifth metacarpophalangeal joints. Lacerations the right fifth finger. EXAM: RIGHT HAND - COMPLETE 3+ VIEW COMPARISON:  None. FINDINGS: There is no evidence of fracture or dislocation. There is no evidence of arthropathy or other focal bone abnormality. Soft tissue swelling seen on the lateral view overlying the metacarpophalangeal joints. Soft tissue laceration involving the fifth digit at the level of the PIP joint. IMPRESSION: Soft tissue swelling over the metacarpophalangeal joints. Soft tissue laceration without radiopaque foreign body noted of the fifth finger. No acute fracture nor dislocation of the right hand and wrist. Electronically Signed   By: Tollie Eth M.D.   On: 11/27/2016 22:21    Procedures Procedures (including critical care time)  Medications Ordered in ED Medications - No data to display   Initial Impression / Assessment and Plan / ED Course  I have reviewed the triage vital signs and the nursing notes.  Pertinent imaging  results that were available during my care of the patient were reviewed by me and considered in my medical decision making (see chart for details).  Pressure irrigation performed. Wound explored and base of wound visualized without evidence of foreign body.  Laceration occurred < 8 hours ago.Tdap updated.  Pt has no no comorbidities to effect normal wound healing. Pt discharged without antibiotics.  Discussed home care with patient and answered questions. Pt to follow-up for wound check as needed. Bacitracin ointment and dressing applied. Return precautions discussed. Pt is hemodynamically stable with no complaints prior to dc.    Final Clinical Impressions(s) / ED Diagnoses   Final diagnoses:  Injury of right hand, initial encounter  Abrasion of right hand, initial encounter    New Prescriptions Discharge Medication List as of 11/27/2016 11:37 PM    START taking these medications   Details  cephALEXin (KEFLEX) 500  MG capsule Take 1 capsule (500 mg total) by mouth 4 (four) times daily., Starting Tue 11/27/2016, Print    diclofenac (VOLTAREN) 50 MG EC tablet Take 1 tablet (50 mg total) by mouth 2 (two) times daily., Starting Tue 11/27/2016, Print      I personally performed the services described in this documentation, which was scribed in my presence. The recorded information has been reviewed and is accurate.     [address] [place], [place] 11/28/16 2203    Loren Racer, MD 12/03/16 [phone]

## 2017-07-01 ENCOUNTER — Other Ambulatory Visit: Payer: Self-pay

## 2017-07-01 ENCOUNTER — Encounter (HOSPITAL_COMMUNITY): Payer: Self-pay | Admitting: Emergency Medicine

## 2017-07-01 ENCOUNTER — Emergency Department (HOSPITAL_COMMUNITY)
Admission: EM | Admit: 2017-07-01 | Discharge: 2017-07-01 | Disposition: A | Discharge status: Home / Self Care | Payer: Self-pay | Attending: Emergency Medicine | Admitting: Emergency Medicine

## 2017-07-01 DIAGNOSIS — F17210 Nicotine dependence, cigarettes, uncomplicated: Secondary | ICD-10-CM | POA: Insufficient documentation

## 2017-07-01 DIAGNOSIS — K029 Dental caries, unspecified: Secondary | ICD-10-CM | POA: Insufficient documentation

## 2017-07-01 DIAGNOSIS — Z79899 Other long term (current) drug therapy: Secondary | ICD-10-CM | POA: Insufficient documentation

## 2017-07-01 MED ORDER — IBUPROFEN 600 MG PO TABS: 600.0000 mg | ORAL_TABLET | Freq: Three times a day (TID) | ORAL | 0 refills | Status: AC | PRN

## 2017-07-01 MED ORDER — IBUPROFEN 200 MG PO TABS
600.0000 mg | ORAL_TABLET | Freq: Once | ORAL | Status: AC
  Administered 2017-07-01: 600 mg via ORAL
  Filled 2017-07-01: qty 3

## 2017-07-01 MED ORDER — PENICILLIN V POTASSIUM 500 MG PO TABS
500.0000 mg | ORAL_TABLET | Freq: Once | ORAL | Status: AC
  Administered 2017-07-01: 500 mg via ORAL
  Filled 2017-07-01: qty 1

## 2017-07-01 MED ORDER — PENICILLIN V POTASSIUM 500 MG PO TABS: 500.0000 mg | ORAL_TABLET | Freq: Three times a day (TID) | ORAL | 0 refills | Status: AC

## 2017-07-01 NOTE — ED Triage Notes (Signed)
Pt reports having pain on left lower portion of jaw for the last two months and has gotten worse where it is hard to open mouth fully. Pt able to speak in full sentences and in no acute distress at time of triage.

## 2017-07-01 NOTE — ED Provider Notes (Signed)
[facility]-EMERGENCY DEPT Provider Note   CSN: [id number] Arrival date & time: 07/01/17  2048     History   Chief Complaint Chief Complaint  Patient presents with  . Dental Pain    HPI Charles Lam is a 31 y.o. male.  HPI Patient presents with several weeks of worsening left lower dental pain with mild developing swelling of his left jaw over the past 2 days.  No fevers or chills.  No difficulty breathing or swallowing.  Painful to chew.  He has not seen a dentist.  Pain is moderate in severity.  No anterior neck swelling.  No other complaints.   Past Medical History:  Diagnosis Date  . Bipolar 1 disorder (HCC)   . Depression     Patient Active Problem List   Diagnosis Date Noted  . Schizoaffective disorder, bipolar type (HCC) 02/05/2016  . Alcohol use disorder, mild, abuse 02/05/2016  . Cannabis use disorder, severe, dependence (HCC) 02/05/2016  . Insomnia   . Hallucinations     History reviewed. No pertinent surgical history.     Home Medications    Prior to Admission medications   Medication Sig Start Date End Date Taking? Authorizing Provider  amLODipine (NORVASC) 5 MG tablet Take 1 tablet (5 mg total) by mouth daily. For high blood pressure 02/09/16   Armandina StammerNwoko, Agnes I, NP  benztropine (COGENTIN) 0.5 MG tablet Take 1 tablet in the morning & at bedtime: For prevention of drug induced tremors 02/09/16   Armandina StammerNwoko, Agnes I, NP  diclofenac (VOLTAREN) 50 MG EC tablet Take 1 tablet (50 mg total) by mouth 2 (two) times daily. 11/27/16   Janne NapoleonNeese, Hope M, NP  divalproex (DEPAKOTE ER) 250 MG 24 hr tablet Take 3 tablets (750 mg total) by mouth at bedtime. For mood stabilization 02/09/16   Armandina StammerNwoko, Agnes I, NP  haloperidol (HALDOL) 5 MG tablet Take 1 tablet (5 mg) in the morning & at bedtime: For mood control 02/09/16   Armandina StammerNwoko, Agnes I, NP  ibuprofen (ADVIL,MOTRIN) 600 MG tablet Take 1 tablet (600 mg total) by mouth every 8 (eight) hours as needed. 07/01/17   Azalia Bilisampos,  Ciclaly Mulcahey, MD  mirtazapine (REMERON) 15 MG tablet Take 1 tablet (15 mg total) by mouth at bedtime. For depression/insomnia 02/09/16   Armandina StammerNwoko, Agnes I, NP  pantoprazole (PROTONIX) 20 MG tablet Take 1 tablet (20 mg total) by mouth 2 (two) times daily before a meal. For acid reflux 02/09/16   Armandina StammerNwoko, Agnes I, NP  penicillin v potassium (VEETID) 500 MG tablet Take 1 tablet (500 mg total) by mouth 3 (three) times daily for 7 days. 07/01/17 07/08/17  Azalia Bilisampos, Zoriyah Scheidegger, MD    Family History Family History  Problem Relation Age of Onset  . Bipolar disorder Mother   . Drug abuse Mother   . Bipolar disorder Sister     Social History Social History   Tobacco Use  . Smoking status: Current Some Day Smoker    Packs/day: 0.25    Types: Cigarettes  . Smokeless tobacco: Never Used  Substance Use Topics  . Alcohol use: No  . Drug use: No     Allergies   Patient has no known allergies.   Review of Systems Review of Systems  All other systems reviewed and are negative.    Physical Exam Updated Vital Signs BP (!) 177/113 (BP Location: Right Arm) Comment: Pt hasn't been taking BP meds.  Pulse 60   Temp 98.3 F (36.8 C) (Oral)  Resp 18   Ht 6\' 2"  (1.88 m)   Wt 68 kg (150 lb)   SpO2 100%   BMI 19.26 kg/m   Physical Exam  Constitutional: He is oriented to person, place, and time. He appears well-developed and well-nourished.  HENT:  Head: Normocephalic.  Dental carry left lower third molar.  No gingival swelling or fluctuance.  Uvula midline.  Tolerating secretions.  Oral airway patent.  Anterior neck normal.  Space under his tongue normal.  Eyes: EOM are normal.  Neck: Normal range of motion.  Pulmonary/Chest: Effort normal.  Abdominal: He exhibits no distension.  Musculoskeletal: Normal range of motion.  Neurological: He is alert and oriented to person, place, and time.  Psychiatric: He has a normal mood and affect.  Nursing note and vitals reviewed.    ED Treatments / Results   Labs (all labs ordered are listed, but only abnormal results are displayed) Labs Reviewed - No data to display  EKG  EKG Interpretation None       Radiology No results found.  Procedures Procedures (including critical care time)  Medications Ordered in ED Medications  penicillin v potassium (VEETID) tablet 500 mg (not administered)  ibuprofen (ADVIL,MOTRIN) tablet 600 mg (not administered)     Initial Impression / Assessment and Plan / ED Course  I have reviewed the triage vital signs and the nursing notes.  Pertinent labs & imaging results that were available during my care of the patient were reviewed by me and considered in my medical decision making (see chart for details).     Dental Pain. Home with antibiotics and pain medicine. Recommend dental follow up. No signs of gingival abscess. Tolerating secretions. Airway patent. No sub lingular swelling   Final Clinical Impressions(s) / ED Diagnoses   Final diagnoses:  Pain due to dental caries    ED Discharge Orders        Ordered    penicillin v potassium (VEETID) 500 MG tablet  3 times daily     07/01/17 2316    ibuprofen (ADVIL,MOTRIN) 600 MG tablet  Every 8 hours PRN     07/01/17 2317       Azalia Bilisampos, Ariannie Penaloza, MD 07/01/17 2319

## 2017-12-05 IMAGING — DX DG HAND COMPLETE 3+V*R*
3 series · 3 of 3 positions shown · non-contrast
Comparison: None.

CLINICAL DATA: Right hand pain after steel door slammed onto the
right hand. Pain and swelling over the third, fourth and fifth
metacarpophalangeal joints. Lacerations the right fifth finger.

EXAM:
RIGHT HAND - COMPLETE 3+ VIEW

[x hand pa right]
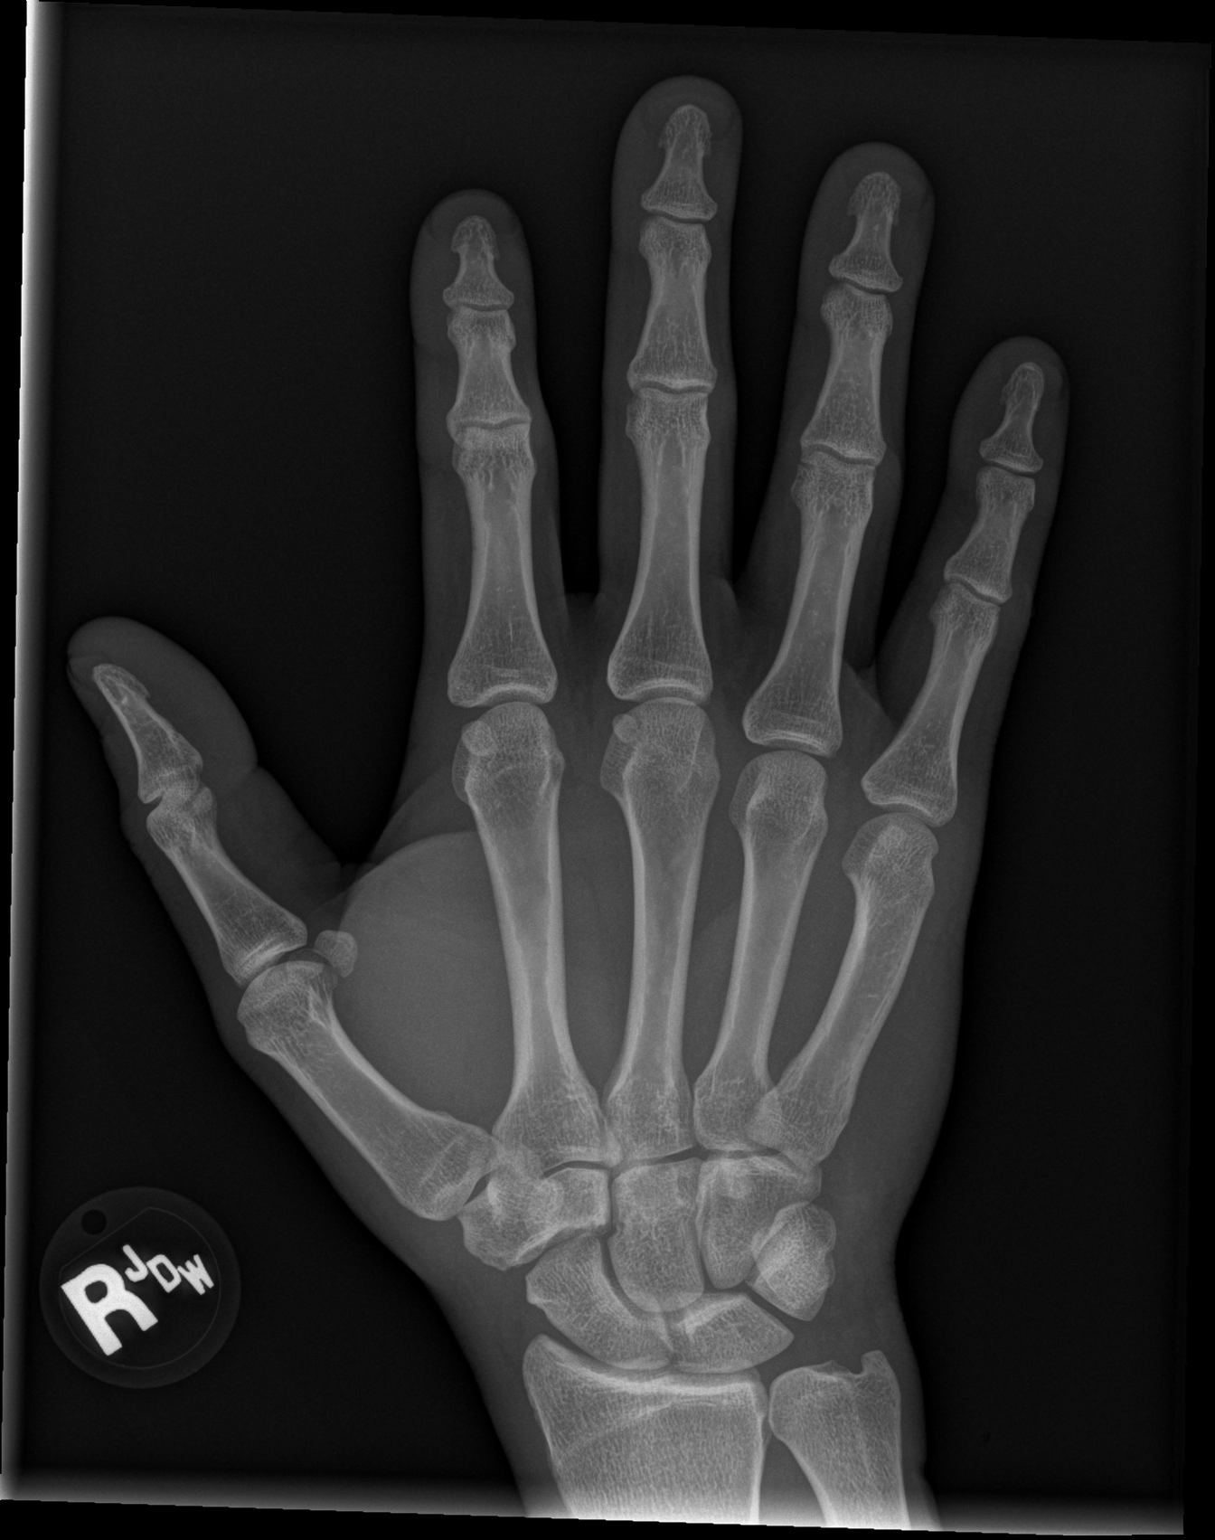

[x hand obl right]
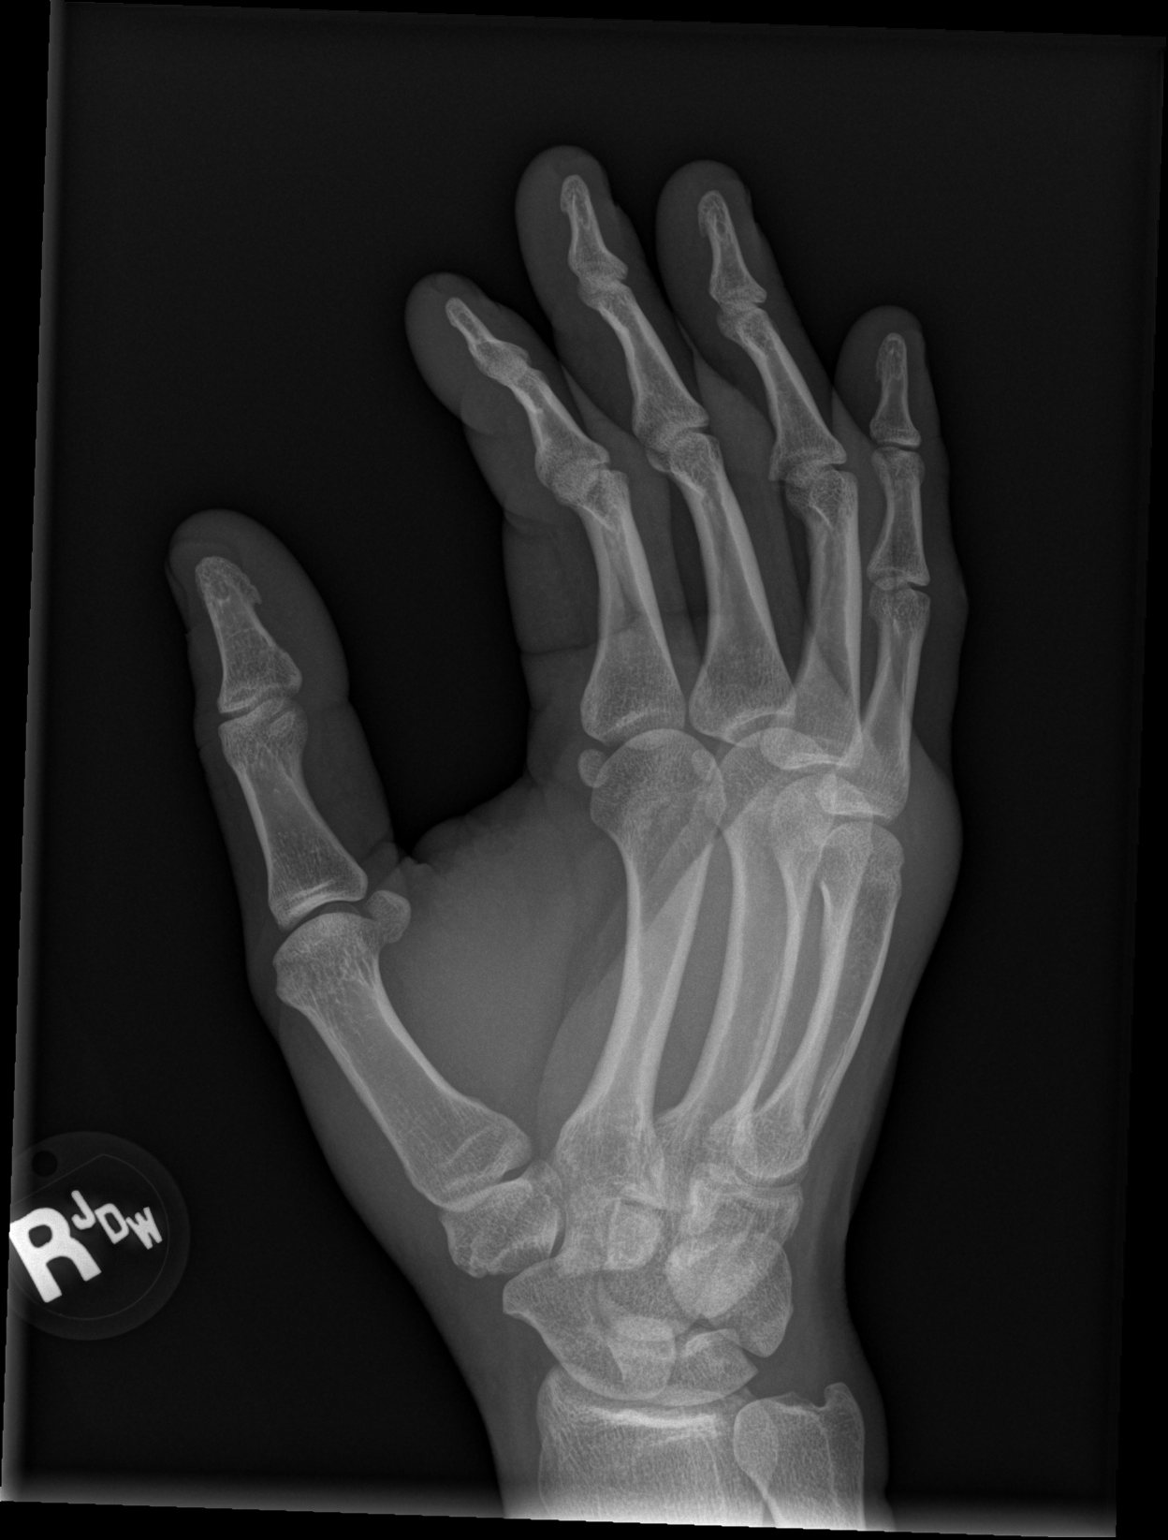

[x hand lat right]
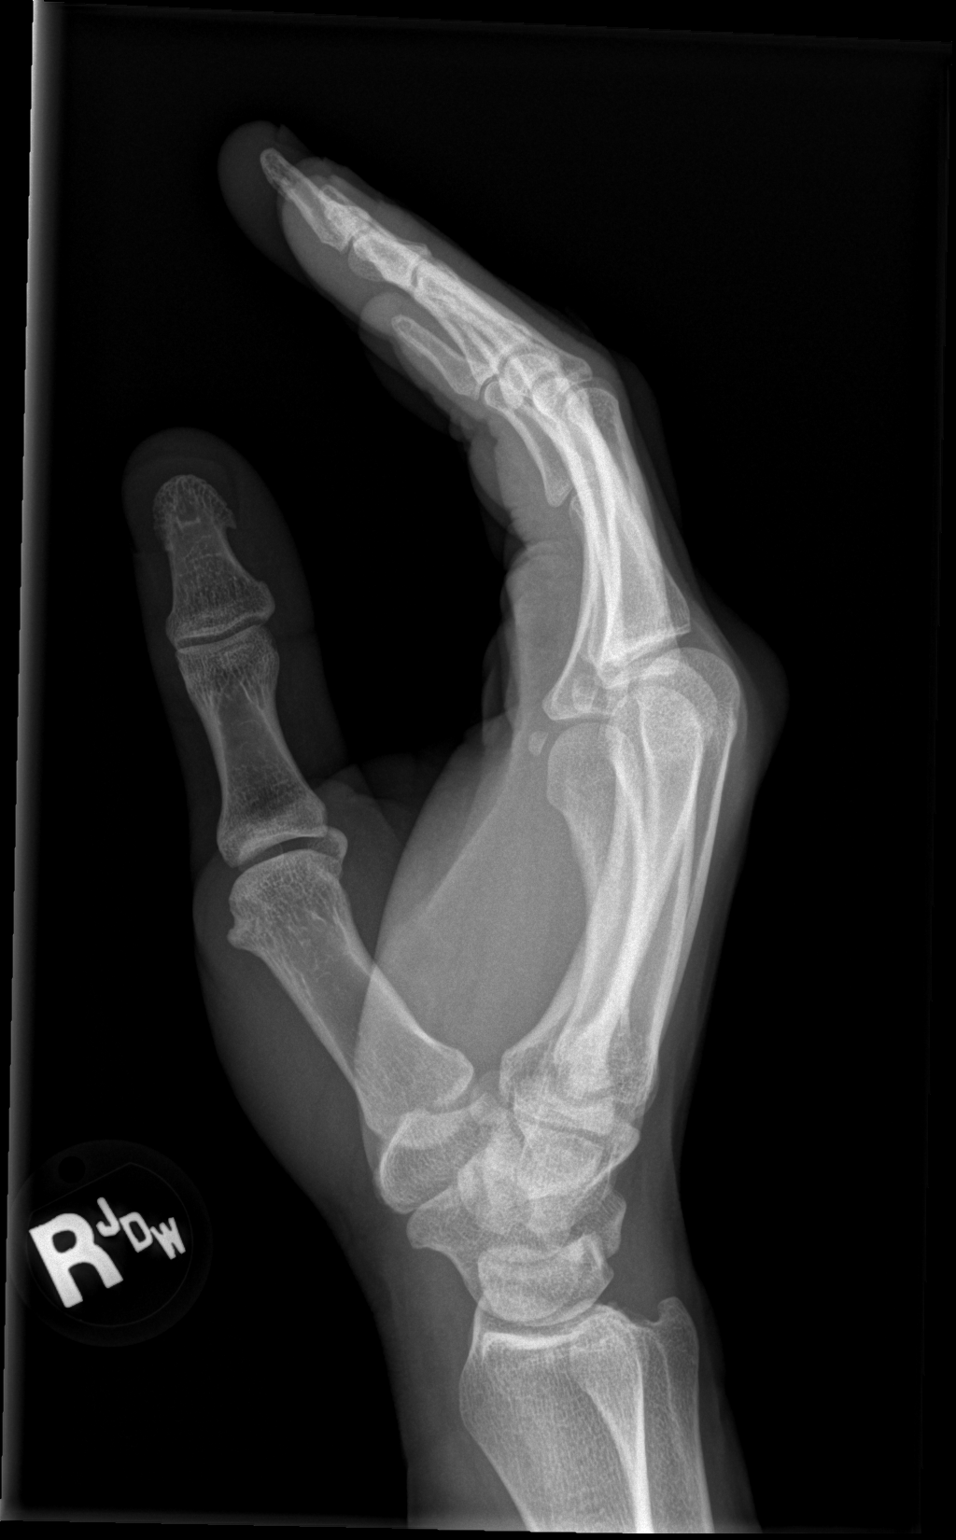

[3 of 3 positions shown; findings below may reference images not displayed]

FINDINGS: There is no evidence of fracture or dislocation. There is no
evidence of arthropathy or other focal bone abnormality. Soft tissue
swelling seen on the lateral view overlying the metacarpophalangeal
joints. Soft tissue laceration involving the fifth digit at the
level of the PIP joint.
IMPRESSION: Soft tissue swelling over the metacarpophalangeal joints. Soft
tissue laceration without radiopaque foreign body noted of the fifth
finger. No acute fracture nor dislocation of the right hand and
wrist.
